# Patient Record
Sex: Male | Born: 1965
Health system: Southern US, Community
[De-identification: ages and names within clinical notes are randomized; demographics above are authoritative.]

## PROBLEM LIST (undated history)

## (undated) ENCOUNTER — Ambulatory Visit: Payer: BC Managed Care – PPO | Source: Home / Self Care

## (undated) HISTORY — PX: COLONOSCOPY: SHX174

## (undated) HISTORY — PX: KNEE ARTHROSCOPY: SUR90

---

## 2005-04-27 ENCOUNTER — Ambulatory Visit (HOSPITAL_COMMUNITY): Admission: RE | Admit: 2005-04-27 | Discharge: 2005-04-27 | Payer: Self-pay | Admitting: Orthopedic Surgery

## 2005-04-27 ENCOUNTER — Ambulatory Visit (HOSPITAL_BASED_OUTPATIENT_CLINIC_OR_DEPARTMENT_OTHER): Admission: RE | Admit: 2005-04-27 | Discharge: 2005-04-27 | Payer: Self-pay | Admitting: Orthopedic Surgery

## 2015-02-04 ENCOUNTER — Emergency Department (HOSPITAL_COMMUNITY)
Admission: EM | Admit: 2015-02-04 | Discharge: 2015-02-04 | Disposition: A | Discharge status: Home / Self Care | Payer: BLUE CROSS/BLUE SHIELD | Source: Home / Self Care | Attending: Family Medicine | Admitting: Family Medicine

## 2015-02-04 ENCOUNTER — Encounter (HOSPITAL_COMMUNITY): Payer: Self-pay | Admitting: Emergency Medicine

## 2015-02-04 DIAGNOSIS — K8050 Calculus of bile duct without cholangitis or cholecystitis without obstruction: Secondary | ICD-10-CM

## 2015-02-04 NOTE — Discharge Instructions (Signed)
Biliary Colic  °Biliary colic is a steady or irregular pain in the upper abdomen. It is usually under the right side of the rib cage. It happens when gallstones interfere with the normal flow of bile from the gallbladder. Bile is a liquid that helps to digest fats. Bile is made in the liver and stored in the gallbladder. When you eat a meal, bile passes from the gallbladder through the cystic duct and the common bile duct into the small intestine. There, it mixes with partially digested food. If a gallstone blocks either of these ducts, the normal flow of bile is blocked. The muscle cells in the bile duct contract forcefully to try to move the stone. This causes the pain of biliary colic.  °SYMPTOMS  °· A person with biliary colic usually complains of pain in the upper abdomen. This pain can be: °¨ In the center of the upper abdomen just below the breastbone. °¨ In the upper-right part of the abdomen, near the gallbladder and liver. °¨ Spread back toward the right shoulder blade. °· Nausea and vomiting. °· The pain usually occurs after eating. °· Biliary colic is usually triggered by the digestive system's demand for bile. The demand for bile is high after fatty meals. Symptoms can also occur when a person who has been fasting suddenly eats a very large meal. Most episodes of biliary colic pass after 1 to 5 hours. After the most intense pain passes, your abdomen may continue to ache mildly for about 24 hours. °DIAGNOSIS  °After you describe your symptoms, your caregiver will perform a physical exam. He or she will pay attention to the upper right portion of your belly (abdomen). This is the area of your liver and gallbladder. An ultrasound will help your caregiver look for gallstones. Specialized scans of the gallbladder may also be done. Blood tests may be done, especially if you have fever or if your pain persists. °PREVENTION  °Biliary colic can be prevented by controlling the risk factors for gallstones. Some of  these risk factors, such as heredity, increasing age, and pregnancy are a normal part of life. Obesity and a high-fat diet are risk factors you can change through a healthy lifestyle. Women going through menopause who take hormone replacement therapy (estrogen) are also more likely to develop biliary colic. °TREATMENT  °· Pain medication may be prescribed. °· You may be encouraged to eat a fat-free diet. °· If the first episode of biliary colic is severe, or episodes of colic keep retuning, surgery to remove the gallbladder (cholecystectomy) is usually recommended. This procedure can be done through small incisions using an instrument called a laparoscope. The procedure often requires a brief stay in the hospital. Some people can leave the hospital the same day. It is the most widely used treatment in people troubled by painful gallstones. It is effective and safe, with no complications in more than 90% of cases. °· If surgery cannot be done, medication that dissolves gallstones may be used. This medication is expensive and can take months or years to work. Only small stones will dissolve. °· Rarely, medication to dissolve gallstones is combined with a procedure called shock-wave lithotripsy. This procedure uses carefully aimed shock waves to break up gallstones. In many people treated with this procedure, gallstones form again within a few years. °PROGNOSIS  °If gallstones block your cystic duct or common bile duct, you are at risk for repeated episodes of biliary colic. There is also a 25% chance that you will develop   a gallbladder infection(acute cholecystitis), or some other complication of gallstones within 10 to 20 years. If you have surgery, schedule it at a time that is convenient for you and at a time when you are not sick. °HOME CARE INSTRUCTIONS  °· Drink plenty of clear fluids. °· Avoid fatty, greasy or fried foods, or any foods that make your pain worse. °· Take medications as directed. °SEEK MEDICAL  CARE IF:  °· You develop a fever over 100.5° F (38.1° C). °· Your pain gets worse over time. °· You develop nausea that prevents you from eating and drinking. °· You develop vomiting. °SEEK IMMEDIATE MEDICAL CARE IF:  °· You have continuous or severe belly (abdominal) pain which is not relieved with medications. °· You develop nausea and vomiting which is not relieved with medications. °· You have symptoms of biliary colic and you suddenly develop a fever and shaking chills. This may signal cholecystitis. Call your caregiver immediately. °· You develop a yellow color to your skin or the white part of your eyes (jaundice). °Document Released: 05/06/2006 Document Revised: 02/25/2012 Document Reviewed: 07/15/2008 °[employer]® Patient Information ©2015 [employer]. This information is not intended to replace advice given to you by your health care provider. Make sure you discuss any questions you have with your health care provider. ° °

## 2015-02-04 NOTE — ED Notes (Signed)
Pt states that he had some very sharp abdominal pain to occur at 1645 today pt states it was so sharp it caused him to throw up. Pt states it lasted for 30 minutes. Pt states the only thing he had to eat was a slice of pizza and a beer at 1630.

## 2015-02-04 NOTE — ED Provider Notes (Signed)
CSN: [id number]     Arrival date & time 02/04/15  1719 History   First MD Initiated Contact with Patient 02/04/15 [place]     Chief Complaint  Patient presents with  . Abdominal Pain   (Consider location/radiation/quality/duration/timing/severity/associated sxs/prior Treatment) HPI         49 year old male presents for evaluation of abdominal pain. He has had earlier today at about 4:30 he had a beer. 15 minutes later he had acute onset of right upper quadrant abdominal pain. Pain was severe and he also had nausea and vomiting. The pain radiated to his back and shoulder. He felt feverish while he was having the pain. After about 15 minutes they decided to go to the emergency department. On the weightbearing the pain started to resolve so they decided to come here instead. At this time, the pain has completely resolved and he is asymptomatic. He denies pain or nausea. He has never had this before. Denies any history of gallstones or any significant history of abdominal pains. He has never had any stomach surgeries.  History reviewed. No pertinent past medical history. History reviewed. No pertinent past surgical history. History reviewed. No pertinent family history. History  Substance Use Topics  . Smoking status: Never Smoker   . Smokeless tobacco: Not on file  . Alcohol Use: Yes    Review of Systems  Constitutional: Positive for fever.  Gastrointestinal: Positive for nausea, vomiting and abdominal pain. Negative for diarrhea and constipation.  All other systems reviewed and are negative.   Allergies  Review of patient's allergies indicates no known allergies.  Home Medications   Prior to Admission medications   Not on File   BP 134/88 mmHg  Pulse 80  Temp(Src) 97.4 F (36.3 C) (Oral)  Resp 16  SpO2 98% Physical Exam  Constitutional: He is oriented to person, place, and time. He appears well-developed and well-nourished. No distress.  HENT:  Head: Normocephalic.   Cardiovascular: Normal rate, regular rhythm and normal heart sounds.   Pulmonary/Chest: Effort normal and breath sounds normal. No respiratory distress.  Abdominal: Soft. Bowel sounds are normal. He exhibits no distension and no mass. There is no tenderness. There is no rebound and no guarding.  Neurological: He is alert and oriented to person, place, and time. Coordination normal.  Skin: Skin is warm and dry. No rash noted. He is not diaphoretic.  Psychiatric: He has a normal mood and affect. Judgment normal.  Nursing note and vitals reviewed.   ED Course  Procedures (including critical care time) Labs Review Labs Reviewed - No data to display  Imaging Review No results found.   MDM   1. Biliary colic    I believe it is most likely he had an attack of biliary colic. At time of this exam, the exam is completely normal. The abdomen is soft and nontender with normal bowel sounds. He is completely asymptomatic and in no distress. I discussed this most likely diagnosis with the patient at length. We discussed the need for follow-up, he will go visit his primary care physician to see about ordering an ultrasound. We also discussed what to do it the pain returns, he will go to the emergency department. Advised low-fat, bland diet for now.      Liam Graham, PA-C 02/04/15 1931

## 2015-02-08 ENCOUNTER — Other Ambulatory Visit: Payer: Self-pay | Admitting: Family Medicine

## 2015-02-08 DIAGNOSIS — K8050 Calculus of bile duct without cholangitis or cholecystitis without obstruction: Secondary | ICD-10-CM

## 2015-02-08 DIAGNOSIS — R1011 Right upper quadrant pain: Secondary | ICD-10-CM

## 2015-02-22 ENCOUNTER — Ambulatory Visit
Admission: RE | Admit: 2015-02-22 | Discharge: 2015-02-22 | Disposition: A | Discharge status: Home / Self Care | Payer: BLUE CROSS/BLUE SHIELD | Source: Ambulatory Visit | Attending: Family Medicine | Admitting: Family Medicine

## 2015-02-22 DIAGNOSIS — R1011 Right upper quadrant pain: Secondary | ICD-10-CM

## 2015-02-22 DIAGNOSIS — K8050 Calculus of bile duct without cholangitis or cholecystitis without obstruction: Secondary | ICD-10-CM

## 2016-11-01 DIAGNOSIS — Z86018 Personal history of other benign neoplasm: Secondary | ICD-10-CM | POA: Diagnosis not present

## 2016-11-01 DIAGNOSIS — L814 Other melanin hyperpigmentation: Secondary | ICD-10-CM | POA: Diagnosis not present

## 2016-11-01 DIAGNOSIS — L821 Other seborrheic keratosis: Secondary | ICD-10-CM | POA: Diagnosis not present

## 2016-11-01 DIAGNOSIS — D1800 Hemangioma unspecified site: Secondary | ICD-10-CM | POA: Diagnosis not present

## 2016-12-28 DIAGNOSIS — Z23 Encounter for immunization: Secondary | ICD-10-CM | POA: Diagnosis not present

## 2017-01-01 DIAGNOSIS — H2513 Age-related nuclear cataract, bilateral: Secondary | ICD-10-CM | POA: Diagnosis not present

## 2017-01-01 DIAGNOSIS — H401131 Primary open-angle glaucoma, bilateral, mild stage: Secondary | ICD-10-CM | POA: Diagnosis not present

## 2017-05-01 DIAGNOSIS — H2513 Age-related nuclear cataract, bilateral: Secondary | ICD-10-CM | POA: Diagnosis not present

## 2017-05-01 DIAGNOSIS — H401131 Primary open-angle glaucoma, bilateral, mild stage: Secondary | ICD-10-CM | POA: Diagnosis not present

## 2017-07-06 DIAGNOSIS — J029 Acute pharyngitis, unspecified: Secondary | ICD-10-CM | POA: Diagnosis not present

## 2017-07-06 DIAGNOSIS — B349 Viral infection, unspecified: Secondary | ICD-10-CM | POA: Diagnosis not present

## 2017-07-08 DIAGNOSIS — T7840XA Allergy, unspecified, initial encounter: Secondary | ICD-10-CM | POA: Diagnosis not present

## 2017-10-28 DIAGNOSIS — R030 Elevated blood-pressure reading, without diagnosis of hypertension: Secondary | ICD-10-CM | POA: Diagnosis not present

## 2017-10-28 DIAGNOSIS — Z125 Encounter for screening for malignant neoplasm of prostate: Secondary | ICD-10-CM | POA: Diagnosis not present

## 2017-10-28 DIAGNOSIS — F430 Acute stress reaction: Secondary | ICD-10-CM | POA: Diagnosis not present

## 2017-11-18 DIAGNOSIS — F430 Acute stress reaction: Secondary | ICD-10-CM | POA: Diagnosis not present

## 2017-11-18 DIAGNOSIS — R030 Elevated blood-pressure reading, without diagnosis of hypertension: Secondary | ICD-10-CM | POA: Diagnosis not present

## 2017-12-25 DIAGNOSIS — Z86018 Personal history of other benign neoplasm: Secondary | ICD-10-CM | POA: Diagnosis not present

## 2017-12-25 DIAGNOSIS — L814 Other melanin hyperpigmentation: Secondary | ICD-10-CM | POA: Diagnosis not present

## 2017-12-25 DIAGNOSIS — L821 Other seborrheic keratosis: Secondary | ICD-10-CM | POA: Diagnosis not present

## 2017-12-25 DIAGNOSIS — D1800 Hemangioma unspecified site: Secondary | ICD-10-CM | POA: Diagnosis not present

## 2018-04-28 DIAGNOSIS — Z13220 Encounter for screening for lipoid disorders: Secondary | ICD-10-CM | POA: Diagnosis not present

## 2018-04-28 DIAGNOSIS — R030 Elevated blood-pressure reading, without diagnosis of hypertension: Secondary | ICD-10-CM | POA: Diagnosis not present

## 2018-04-28 DIAGNOSIS — Z1211 Encounter for screening for malignant neoplasm of colon: Secondary | ICD-10-CM | POA: Diagnosis not present

## 2018-08-06 DIAGNOSIS — D1801 Hemangioma of skin and subcutaneous tissue: Secondary | ICD-10-CM | POA: Diagnosis not present

## 2018-08-06 DIAGNOSIS — L821 Other seborrheic keratosis: Secondary | ICD-10-CM | POA: Diagnosis not present

## 2018-08-15 DIAGNOSIS — K640 First degree hemorrhoids: Secondary | ICD-10-CM | POA: Diagnosis not present

## 2018-08-15 DIAGNOSIS — D126 Benign neoplasm of colon, unspecified: Secondary | ICD-10-CM | POA: Diagnosis not present

## 2018-08-15 DIAGNOSIS — Z86010 Personal history of colonic polyps: Secondary | ICD-10-CM | POA: Diagnosis not present

## 2018-08-20 DIAGNOSIS — D126 Benign neoplasm of colon, unspecified: Secondary | ICD-10-CM | POA: Diagnosis not present

## 2018-09-01 ENCOUNTER — Ambulatory Visit: Payer: Self-pay | Admitting: Surgery

## 2018-09-01 DIAGNOSIS — K621 Rectal polyp: Secondary | ICD-10-CM | POA: Diagnosis not present

## 2018-10-02 NOTE — Patient Instructions (Addendum)
Nicholas Bennett  10/02/2018   Your procedure is scheduled on: 10-08-18     Report to [facility]  Entrance    Report to Admitting at 10:15  AM    Call this number if you have problems the morning of surgery [phone]     Remember: DRINK 2 PRESURGERY ENSURE DRINKS THE NIGHT BEFORE SURGERY AT 1000 PM AND 1 PRESURGERY DRINK THE DAY OF THE PROCEDURE 3 HOURS PRIOR TO SCHEDULED SURGERY. NO SOLIDS AFTER MIDNIGHT THE DAY PRIOR TO THE SURGERY. NOTHING BY MOUTH EXCEPT CLEAR LIQUIDS UNTIL THREE HOURS PRIOR TO SCHEDULED SURGERY. PLEASE FINISH PRESURGERY ENSURE DRINK PER SURGEON ORDER 3 HOURS PRIOR TO SCHEDULED SURGERY TIME WHICH NEEDS TO BE COMPLETED AT 9:15 AM.     CLEAR LIQUID DIET   Foods Allowed                                                                     Foods Excluded  Coffee and tea, regular and decaf                             liquids that you cannot  Plain Jell-O in any flavor                                             see through such as: Fruit ices (not with fruit pulp)                                     milk, soups, orange juice  Iced Popsicles                                    All solid food Carbonated beverages, regular and diet                                    Cranberry, grape and apple juices Sports drinks like Gatorade Lightly seasoned clear broth or consume(fat free) Sugar, honey syrup  Sample Menu Breakfast                                Lunch                                     Supper Cranberry juice                    Beef broth                            Chicken broth Jell-O  Grape juice                           Apple juice Coffee or tea                        Jell-O                                      Popsicle                                                Coffee or tea                        Coffee or tea  _____________________________________________________________________    BRUSH  YOUR TEETH MORNING OF SURGERY AND RINSE YOUR MOUTH OUT, NO CHEWING GUM CANDY OR MINTS.     Take these medicines the morning of surgery with A SIP OF WATER: Loratadine (Claritin). You can bring and use your nasal spray as needed.                                You may not have any metal on your body including hair pins and              piercings  Do not wear jewelry, lotions, powders, cologne or deodorant             Men may shave face and neck.   Do not bring valuables to the hospital. [place].  Contacts, dentures or bridgework may not be worn into surgery.      Patients discharged the day of surgery will not be allowed to drive home.  Name and phone number of your driver: Nicholas Bennett [phone]  Special Instructions: Please consume a Clear Liquid diet along with your prep. Per your surgeon's instructions.              Please read over the following fact sheets you were given: _____________________________________________________________________  [facility] - Preparing for Surgery Before surgery, you can play an important role.  Because skin is not sterile, your skin needs to be as free of germs as possible.  You can reduce the number of germs on your skin by washing with CHG (chlorahexidine gluconate) soap before surgery.  CHG is an antiseptic cleaner which kills germs and bonds with the skin to continue killing germs even after washing. Please DO NOT use if you have an allergy to CHG or antibacterial soaps.  If your skin becomes reddened/irritated stop using the CHG and inform your nurse when you arrive at Short Stay. Do not shave (including legs and underarms) for at least 48 hours prior to the first CHG shower.  You may shave your face/neck. Please follow these instructions carefully:  1.  Shower with CHG Soap the night before surgery and the  morning of Surgery.  2.  If you choose to wash your hair, wash your hair first as usual  with your  normal  shampoo.  3.  After you  shampoo, rinse your hair and body thoroughly to remove the  shampoo.                           4.  Use CHG as you would any other liquid soap.  You can apply chg directly  to the skin and wash                       Gently with a scrungie or clean washcloth.  5.  Apply the CHG Soap to your body ONLY FROM THE NECK DOWN.   Do not use on face/ open                           Wound or open sores. Avoid contact with eyes, ears mouth and genitals (private parts).                       Wash face,  Genitals (private parts) with your normal soap.             6.  Wash thoroughly, paying special attention to the area where your surgery  will be performed.  7.  Thoroughly rinse your body with warm water from the neck down.  8.  DO NOT shower/wash with your normal soap after using and rinsing off  the CHG Soap.                9.  Pat yourself dry with a clean towel.            10.  Wear clean pajamas.            11.  Place clean sheets on your bed the night of your first shower and do not  sleep with pets. Day of Surgery : Do not apply any lotions/deodorants the morning of surgery.  Please wear clean clothes to the hospital/surgery center.  FAILURE TO FOLLOW THESE INSTRUCTIONS MAY RESULT IN THE CANCELLATION OF YOUR SURGERY PATIENT SIGNATURE_________________________________  NURSE SIGNATURE__________________________________  ________________________________________________________________________   Nicholas Bennett  An incentive spirometer is a tool that can help keep your lungs clear and active. This tool measures how well you are filling your lungs with each breath. Taking long deep breaths may help reverse or decrease the chance of developing breathing (pulmonary) problems (especially infection) following:  A long period of time when you are unable to move or be active. BEFORE THE PROCEDURE   If the spirometer includes an indicator to show your best  effort, your nurse or respiratory therapist will set it to a desired goal.  If possible, sit up straight or lean slightly forward. Try not to slouch.  Hold the incentive spirometer in an upright position. INSTRUCTIONS FOR USE  1. Sit on the edge of your bed if possible, or sit up as far as you can in bed or on a chair. 2. Hold the incentive spirometer in an upright position. 3. Breathe out normally. 4. Place the mouthpiece in your mouth and seal your lips tightly around it. 5. Breathe in slowly and as deeply as possible, raising the piston or the ball toward the top of the column. 6. Hold your breath for 3-5 seconds or for as long as possible. Allow the piston or ball to fall to the bottom of the column. 7. Remove the mouthpiece from your mouth and breathe out normally. 8. Rest for a few seconds  and repeat Steps 1 through 7 at least 10 times every 1-2 hours when you are awake. Take your time and take a few normal breaths between deep breaths. 9. The spirometer may include an indicator to show your best effort. Use the indicator as a goal to work toward during each repetition. 10. After each set of 10 deep breaths, practice coughing to be sure your lungs are clear. If you have an incision (the cut made at the time of surgery), support your incision when coughing by placing a pillow or rolled up towels firmly against it. Once you are able to get out of bed, walk around indoors and cough well. You may stop using the incentive spirometer when instructed by your caregiver.  RISKS AND COMPLICATIONS  Take your time so you do not get dizzy or light-headed.  If you are in pain, you may need to take or ask for pain medication before doing incentive spirometry. It is harder to take a deep breath if you are having pain. AFTER USE  Rest and breathe slowly and easily.  It can be helpful to keep track of a log of your progress. Your caregiver can provide you with a simple table to help with this. If you  are using the spirometer at home, follow these instructions: [place] IF:   You are having difficultly using the spirometer.  You have trouble using the spirometer as often as instructed.  Your pain medication is not giving enough relief while using the spirometer.  You develop fever of 100.5 F (38.1 C) or higher. SEEK IMMEDIATE MEDICAL CARE IF:   You cough up bloody sputum that had not been present before.  You develop fever of 102 F (38.9 C) or greater.  You develop worsening pain at or near the incision site. MAKE SURE YOU:   Understand these instructions.  Will watch your condition.  Will get help right away if you are not doing well or get worse. Document Released: 04/15/2007 Document Revised: 02/25/2012 Document Reviewed: 06/16/2007 [employer] Patient Information 2014 [place], [place].   ________________________________________________________________________

## 2018-10-03 ENCOUNTER — Other Ambulatory Visit: Payer: Self-pay

## 2018-10-03 ENCOUNTER — Encounter (HOSPITAL_COMMUNITY): Payer: Self-pay

## 2018-10-03 ENCOUNTER — Encounter (HOSPITAL_COMMUNITY)
Admission: RE | Admit: 2018-10-03 | Discharge: 2018-10-03 | Disposition: A | Discharge status: Home / Self Care | Payer: BLUE CROSS/BLUE SHIELD | Source: Ambulatory Visit | Attending: Surgery | Admitting: Surgery

## 2018-10-03 DIAGNOSIS — K621 Rectal polyp: Secondary | ICD-10-CM | POA: Insufficient documentation

## 2018-10-03 DIAGNOSIS — Z01812 Encounter for preprocedural laboratory examination: Secondary | ICD-10-CM | POA: Insufficient documentation

## 2018-10-03 LAB — BASIC METABOLIC PANEL
Anion gap: 10 (ref 5–15)
BUN: 14 mg/dL (ref 6–20)
CHLORIDE: 103 mmol/L (ref 98–111)
CO2: 25 mmol/L (ref 22–32)
Calcium: 9.4 mg/dL (ref 8.9–10.3)
Creatinine, Ser: 1.03 mg/dL (ref 0.61–1.24)
GFR calc non Af Amer: >60 mL/min (ref >60)
Glucose, Bld: 99 mg/dL (ref 70–99)
Potassium: 4.3 mmol/L (ref 3.5–5.1)
SODIUM: 138 mmol/L (ref 135–145)

## 2018-10-03 LAB — CBC
HEMATOCRIT: 46 % (ref 39.0–52.0)
HEMOGLOBIN: 15.3 g/dL (ref 13.0–17.0)
MCH: 32.2 pg (ref 26.0–34.0)
MCHC: 33.3 g/dL (ref 30.0–36.0)
MCV: 96.8 fL (ref 80.0–100.0)
NRBC: 0 % (ref 0.0–0.2)
Platelets: 266 10*3/uL (ref 150–400)
RBC: 4.75 MIL/uL (ref 4.22–5.81)
RDW: 12.5 % (ref 11.5–15.5)
WBC: 7.6 10*3/uL (ref 4.0–10.5)

## 2018-10-07 MED ORDER — BUPIVACAINE LIPOSOME 1.3 % IJ SUSP
20.0000 mL | Freq: Once | INTRAMUSCULAR | Status: DC
  Filled 2018-10-07: qty 20

## 2018-10-08 ENCOUNTER — Other Ambulatory Visit: Payer: Self-pay

## 2018-10-08 ENCOUNTER — Encounter (HOSPITAL_COMMUNITY)
Admission: RE | Disposition: A | Discharge status: Home / Self Care | Payer: Self-pay | Source: Ambulatory Visit | Attending: Surgery

## 2018-10-08 ENCOUNTER — Ambulatory Visit (HOSPITAL_COMMUNITY)
Admission: RE | Admit: 2018-10-08 | Discharge: 2018-10-08 | Disposition: A | Discharge status: Home / Self Care | Payer: BLUE CROSS/BLUE SHIELD | Source: Ambulatory Visit | Attending: Surgery | Admitting: Surgery

## 2018-10-08 ENCOUNTER — Ambulatory Visit (HOSPITAL_COMMUNITY): Payer: BLUE CROSS/BLUE SHIELD | Admitting: Certified Registered Nurse Anesthetist

## 2018-10-08 ENCOUNTER — Telehealth (HOSPITAL_COMMUNITY): Payer: Self-pay | Admitting: *Deleted

## 2018-10-08 ENCOUNTER — Encounter (HOSPITAL_COMMUNITY): Payer: Self-pay | Admitting: *Deleted

## 2018-10-08 DIAGNOSIS — Z79899 Other long term (current) drug therapy: Secondary | ICD-10-CM | POA: Diagnosis not present

## 2018-10-08 DIAGNOSIS — Z860100 Personal history of colon polyps, unspecified: Secondary | ICD-10-CM

## 2018-10-08 DIAGNOSIS — Z8371 Family history of colonic polyps: Secondary | ICD-10-CM | POA: Insufficient documentation

## 2018-10-08 DIAGNOSIS — K621 Rectal polyp: Secondary | ICD-10-CM

## 2018-10-08 DIAGNOSIS — Z7951 Long term (current) use of inhaled steroids: Secondary | ICD-10-CM | POA: Insufficient documentation

## 2018-10-08 DIAGNOSIS — D128 Benign neoplasm of rectum: Secondary | ICD-10-CM | POA: Insufficient documentation

## 2018-10-08 DIAGNOSIS — K649 Unspecified hemorrhoids: Secondary | ICD-10-CM | POA: Diagnosis not present

## 2018-10-08 HISTORY — PX: PARTIAL PROCTECTOMY BY TEM: SHX6011

## 2018-10-08 SURGERY — PARTIAL PROCTECTOMY BY TEM
Anesthesia: General | Site: Rectum

## 2018-10-08 MED ORDER — GABAPENTIN 300 MG PO CAPS
300.0000 mg | ORAL_CAPSULE | ORAL | Status: AC
  Administered 2018-10-08: 300 mg via ORAL
  Filled 2018-10-08: qty 1

## 2018-10-08 MED ORDER — FENTANYL CITRATE (PF) 100 MCG/2ML IJ SOLN: 25.0000 ug | INTRAMUSCULAR | Status: DC | PRN

## 2018-10-08 MED ORDER — MIDAZOLAM HCL 5 MG/5ML IJ SOLN
INTRAMUSCULAR | Status: DC | PRN
  Administered 2018-10-08 (×2): 1 mg via INTRAVENOUS

## 2018-10-08 MED ORDER — CELECOXIB 200 MG PO CAPS: 200.0000 mg | ORAL_CAPSULE | ORAL | Status: DC

## 2018-10-08 MED ORDER — OXYCODONE HCL 5 MG PO TABS
ORAL_TABLET | ORAL | Status: AC
  Filled 2018-10-08: qty 1

## 2018-10-08 MED ORDER — SODIUM CHLORIDE 0.9 % IV SOLN: 250.0000 mL | INTRAVENOUS | Status: DC | PRN

## 2018-10-08 MED ORDER — SODIUM CHLORIDE 0.9 % IV SOLN
2.0000 g | INTRAVENOUS | Status: AC
  Administered 2018-10-08: 2 g via INTRAVENOUS
  Filled 2018-10-08: qty 2

## 2018-10-08 MED ORDER — CHLORHEXIDINE GLUCONATE CLOTH 2 % EX PADS: 6.0000 | MEDICATED_PAD | Freq: Once | CUTANEOUS | Status: DC

## 2018-10-08 MED ORDER — MIDAZOLAM HCL 2 MG/2ML IJ SOLN
INTRAMUSCULAR | Status: AC
  Filled 2018-10-08: qty 2

## 2018-10-08 MED ORDER — KETAMINE HCL 10 MG/ML IJ SOLN
INTRAMUSCULAR | Status: DC | PRN
  Administered 2018-10-08: 30 mg via INTRAVENOUS

## 2018-10-08 MED ORDER — FENTANYL CITRATE (PF) 250 MCG/5ML IJ SOLN
INTRAMUSCULAR | Status: AC
  Filled 2018-10-08: qty 5

## 2018-10-08 MED ORDER — PROPOFOL 10 MG/ML IV BOLUS
INTRAVENOUS | Status: AC
  Filled 2018-10-08: qty 20

## 2018-10-08 MED ORDER — OXYCODONE HCL 5 MG PO TABS
5.0000 mg | ORAL_TABLET | ORAL | Status: DC | PRN
  Administered 2018-10-08: 5 mg via ORAL

## 2018-10-08 MED ORDER — LIDOCAINE 2% (20 MG/ML) 5 ML SYRINGE
INTRAMUSCULAR | Status: DC | PRN
  Administered 2018-10-08: 50 mg via INTRAVENOUS

## 2018-10-08 MED ORDER — MEPERIDINE HCL 50 MG/ML IJ SOLN: 6.2500 mg | INTRAMUSCULAR | Status: DC | PRN

## 2018-10-08 MED ORDER — BUPIVACAINE LIPOSOME 1.3 % IJ SUSP
INTRAMUSCULAR | Status: DC | PRN
  Administered 2018-10-08: 20 mL

## 2018-10-08 MED ORDER — SUGAMMADEX SODIUM 200 MG/2ML IV SOLN
INTRAVENOUS | Status: DC | PRN
  Administered 2018-10-08: 200 mg via INTRAVENOUS

## 2018-10-08 MED ORDER — BISACODYL 5 MG PO TBEC
20.0000 mg | DELAYED_RELEASE_TABLET | Freq: Once | ORAL | Status: DC
  Filled 2018-10-08: qty 4

## 2018-10-08 MED ORDER — OXYCODONE HCL 5 MG PO TABS: 5.0000 mg | ORAL_TABLET | Freq: Four times a day (QID) | ORAL | 0 refills | Status: AC | PRN

## 2018-10-08 MED ORDER — METOCLOPRAMIDE HCL 5 MG/ML IJ SOLN: 10.0000 mg | Freq: Once | INTRAMUSCULAR | Status: DC | PRN

## 2018-10-08 MED ORDER — ALVIMOPAN 12 MG PO CAPS
12.0000 mg | ORAL_CAPSULE | ORAL | Status: AC
  Administered 2018-10-08: 12 mg via ORAL
  Filled 2018-10-08: qty 1

## 2018-10-08 MED ORDER — AMBULATORY NON FORMULARY MEDICATION: 1.0000 "application " | Freq: Four times a day (QID) | 2 refills | Status: AC

## 2018-10-08 MED ORDER — POLYETHYLENE GLYCOL 3350 17 GM/SCOOP PO POWD: 1.0000 | Freq: Once | ORAL | Status: DC

## 2018-10-08 MED ORDER — BUPIVACAINE HCL (PF) 0.25 % IJ SOLN
INTRAMUSCULAR | Status: AC
  Filled 2018-10-08: qty 60

## 2018-10-08 MED ORDER — EPHEDRINE SULFATE-NACL 50-0.9 MG/10ML-% IV SOSY
PREFILLED_SYRINGE | INTRAVENOUS | Status: DC | PRN
  Administered 2018-10-08: 10 mg via INTRAVENOUS

## 2018-10-08 MED ORDER — LACTATED RINGERS IV SOLN: INTRAVENOUS | Status: DC

## 2018-10-08 MED ORDER — ACETAMINOPHEN 500 MG PO TABS
1000.0000 mg | ORAL_TABLET | ORAL | Status: AC
  Administered 2018-10-08: 1000 mg via ORAL
  Filled 2018-10-08: qty 2

## 2018-10-08 MED ORDER — PROPOFOL 10 MG/ML IV BOLUS
INTRAVENOUS | Status: DC | PRN
  Administered 2018-10-08: 200 mg via INTRAVENOUS

## 2018-10-08 MED ORDER — ONDANSETRON HCL 4 MG/2ML IJ SOLN
INTRAMUSCULAR | Status: DC | PRN
  Administered 2018-10-08: 4 mg via INTRAVENOUS

## 2018-10-08 MED ORDER — FENTANYL CITRATE (PF) 100 MCG/2ML IJ SOLN
INTRAMUSCULAR | Status: DC | PRN
  Administered 2018-10-08: 100 ug via INTRAVENOUS
  Administered 2018-10-08 (×2): 50 ug via INTRAVENOUS

## 2018-10-08 MED ORDER — LACTATED RINGERS IV SOLN
INTRAVENOUS | Status: DC
  Administered 2018-10-08 (×2): via INTRAVENOUS

## 2018-10-08 MED ORDER — LIDOCAINE 2% (20 MG/ML) 5 ML SYRINGE
INTRAMUSCULAR | Status: DC | PRN
  Administered 2018-10-08: 3.85 mL/h via INTRAVENOUS

## 2018-10-08 MED ORDER — ENOXAPARIN SODIUM 40 MG/0.4ML ~~LOC~~ SOLN
40.0000 mg | Freq: Once | SUBCUTANEOUS | Status: AC
  Administered 2018-10-08: 40 mg via SUBCUTANEOUS
  Filled 2018-10-08: qty 0.4

## 2018-10-08 MED ORDER — SODIUM CHLORIDE 0.9% FLUSH: 3.0000 mL | INTRAVENOUS | Status: DC | PRN

## 2018-10-08 MED ORDER — BUPIVACAINE HCL (PF) 0.25 % IJ SOLN
INTRAMUSCULAR | Status: AC
  Filled 2018-10-08: qty 30

## 2018-10-08 MED ORDER — ROCURONIUM BROMIDE 10 MG/ML (PF) SYRINGE
PREFILLED_SYRINGE | INTRAVENOUS | Status: DC | PRN
  Administered 2018-10-08: 10 mg via INTRAVENOUS
  Administered 2018-10-08: 50 mg via INTRAVENOUS

## 2018-10-08 MED ORDER — NEOMYCIN SULFATE 500 MG PO TABS
1000.0000 mg | ORAL_TABLET | ORAL | Status: DC
  Filled 2018-10-08: qty 2

## 2018-10-08 MED ORDER — EPINEPHRINE PF 1 MG/ML IJ SOLN
INTRAMUSCULAR | Status: AC
  Filled 2018-10-08: qty 1

## 2018-10-08 MED ORDER — SODIUM CHLORIDE 0.9 % IV SOLN
INTRAVENOUS | Status: DC
  Filled 2018-10-08: qty 6

## 2018-10-08 MED ORDER — METRONIDAZOLE 500 MG PO TABS
1000.0000 mg | ORAL_TABLET | ORAL | Status: DC
  Filled 2018-10-08: qty 2

## 2018-10-08 MED ORDER — SODIUM CHLORIDE 0.9% FLUSH: 3.0000 mL | Freq: Two times a day (BID) | INTRAVENOUS | Status: DC

## 2018-10-08 MED FILL — oxyCODONE HCL 5 MG TABS: 5 | 3 days supply | Qty: 30 | Fill #0

## 2018-10-08 SURGICAL SUPPLY — 50 items
BLADE SURG 15 STRL LF DISP TIS (BLADE) IMPLANT
BLADE SURG 15 STRL SS (BLADE)
BRIEF STRETCH FOR OB PAD LRG (UNDERPADS AND DIAPERS) IMPLANT
CABLE HIGH FREQUENCY MONO STRZ (ELECTRODE) ×2 IMPLANT
COVER WAND RF STERILE (DRAPES) ×1 IMPLANT
DRAPE LAPAROTOMY T 102X78X121 (DRAPES) ×1 IMPLANT
DRAPE SURG IRRIG POUCH 19X23 (DRAPES) ×2 IMPLANT
DRAPE WARM FLUID 44X44 (DRAPE) ×2 IMPLANT
DRSG PAD ABDOMINAL 8X10 ST (GAUZE/BANDAGES/DRESSINGS) IMPLANT
ELECT PENCIL ROCKER SW 15FT (MISCELLANEOUS) IMPLANT
ELECT REM PT RETURN 15FT ADLT (MISCELLANEOUS) ×2 IMPLANT
GAUZE 4X4 16PLY RFD (DISPOSABLE) ×2 IMPLANT
GAUZE SPONGE 4X4 12PLY STRL (GAUZE/BANDAGES/DRESSINGS) ×1 IMPLANT
GLOVE ECLIPSE 8.0 STRL XLNG CF (GLOVE) ×4 IMPLANT
GLOVE INDICATOR 8.0 STRL GRN (GLOVE) ×4 IMPLANT
GOWN STRL REUS W/TWL XL LVL3 (GOWN DISPOSABLE) ×6 IMPLANT
KIT BASIN OR (CUSTOM PROCEDURE TRAY) ×2 IMPLANT
LEGGING LITHOTOMY PAIR STRL (DRAPES) ×2 IMPLANT
LUBRICANT JELLY K Y 4OZ (MISCELLANEOUS) ×2 IMPLANT
NEEDLE HYPO 22GX1.5 SAFETY (NEEDLE) ×2 IMPLANT
PACK BASIC VI WITH GOWN DISP (CUSTOM PROCEDURE TRAY) ×2 IMPLANT
PAD POSITIONING PINK XL (MISCELLANEOUS) ×1 IMPLANT
RETRACTOR LONE STAR DISPOSABLE (INSTRUMENTS) IMPLANT
RETRACTOR STAY HOOK 5MM (MISCELLANEOUS) IMPLANT
SCISSORS LAP 5X35 DISP (ENDOMECHANICALS) ×2 IMPLANT
SET IRRIG TUBING LAPAROSCOPIC (IRRIGATION / IRRIGATOR) ×2 IMPLANT
SHEARS HARMONIC ACE PLUS 36CM (ENDOMECHANICALS) ×2 IMPLANT
SPONGE LAP 18X18 RF (DISPOSABLE) ×1 IMPLANT
STOPCOCK 4 WAY LG BORE MALE ST (IV SETS) ×1 IMPLANT
SUT CHROMIC 3 0 SH 27 (SUTURE) IMPLANT
SUT PDS AB 2-0 CT2 27 (SUTURE) IMPLANT
SUT PDS AB 3-0 SH 27 (SUTURE) IMPLANT
SUT SILK 2 0 (SUTURE)
SUT SILK 2 0 SH CR/8 (SUTURE) IMPLANT
SUT SILK 2-0 18XBRD TIE 12 (SUTURE) IMPLANT
SUT SILK 3 0 SH 30 (SUTURE) IMPLANT
SUT SILK 3 0 SH CR/8 (SUTURE) IMPLANT
SUT V-LOC BARB 180 2/0GR6 GS22 (SUTURE)
SUT VIC AB 2-0 UR6 27 (SUTURE) ×3 IMPLANT
SUT VIC AB 3-0 SH 27 (SUTURE)
SUT VIC AB 3-0 SH 27XBRD (SUTURE) IMPLANT
SUTURE V-LC BRB 180 2/0GR6GS22 (SUTURE) IMPLANT
SYR 20CC LL (SYRINGE) ×2 IMPLANT
SYR BULB IRRIGATION 50ML (SYRINGE) IMPLANT
TOWEL OR 17X26 10 PK STRL BLUE (TOWEL DISPOSABLE) ×2 IMPLANT
TOWEL OR NON WOVEN STRL DISP B (DISPOSABLE) ×2 IMPLANT
TRAY FOLEY MTR SLVR 16FR STAT (SET/KITS/TRAYS/PACK) ×2 IMPLANT
TUBING CONNECTING 10 (TUBING) IMPLANT
TUBING INSUF HEATED (TUBING) ×2 IMPLANT
YANKAUER SUCT BULB TIP 10FT TU (MISCELLANEOUS) IMPLANT

## 2018-10-08 NOTE — Discharge Instructions (Signed)
ANORECTAL SURGERY:  °POST OPERATIVE INSTRUCTIONS ° °###################################################################### ° °EAT °Start with a pureed / full liquid diet °After 24 hours, gradually transition to a high fiber diet.   ° °CONTROL PAIN °Control pain so you can tolerate bowel movements,  °walk, sleep, tolerate sneezing/coughing, and go up/down stairs. ° ° °HAVE A BOWEL MOVEMENT DAILY °Keep your bowels regular to avoid problems.   °Taking a fiber supplement every day to keep bowels soft.   °Try a laxative to override constipation. °Use an antidairrheal to slow down diarrhea.   °Call if not better after 2 tries ° °WALK °Walk an hour a day.  Control your pain to do that. °  °CALL IF YOU HAVE PROBLEMS/CONCERNS °Call if you are still struggling despite following these instructions. °Call if you have concerns not answered by these instructions ° °###################################################################### ° ° ° °1. Take your usually prescribed home medications unless otherwise directed. °2. DIET: Follow a light bland diet the first 24 hours after arrival home, such as soup, liquids, crackers, etc.  Be sure to include lots of fluids daily.  Avoid fast food or heavy meals as your are more likely to get nauseated.  Eat a low fat the next few days after surgery.   °3. PAIN CONTROL: °a. Pain is best controlled by a usual combination of three different methods TOGETHER: °i. Ice/Heat °ii. Over the counter pain medication °iii. Prescription pain medication °b. Expect swelling and discomfort in the anus/rectal area.  Warm water baths (30-60 minutes up to 6 times a day, especially after bowel meovements) will help. Use ice for the first few days to help decrease swelling and bruising, then switch to heat such as warm towels, sitz baths, warm baths, etc to help relax tight/sore spots and speed recovery.  Some people prefer to use ice alone, heat alone, alternating between ice & heat.  Experiment to what works  for you.   °c. It is helpful to take an over-the-counter pain medication continuously for the first few weeks.  Choose one of the following that works best for you: °i. Naproxen (Aleve, etc)  Two 220mg tabs twice a day °ii. Ibuprofen (Advil, etc) Three 200mg tabs four times a day (every meal & bedtime) °iii. Acetaminophen (Tylenol, etc) 500-650mg four times a day (every meal & bedtime) °d. A  prescription for pain medication (such as oxycodone, hydrocodone, etc) should be given to you upon discharge.  Take your pain medication as prescribed.  °i. If you are having problems/concerns with the prescription medicine (does not control pain, nausea, vomiting, rash, itching, etc), please call us [phone] to see if we need to switch you to a different pain medicine that will work better for you and/or control your side effect better. °ii. If you need a refill on your pain medication, please contact your pharmacy.  They will contact our office to request authorization. Prescriptions will not be filled after 5 pm or on week-ends.  If can take up to 48 hours for it to be filled & ready so avoid waiting until you are down to thel ast pill. °e. A topical cream (Dibucaine) or a prescription for a cream (such as diltiazem 2% gel) may be given to you.  Many people find relief with topical creams.  Some people find it burns too much.  Experiment.  If it helps, use it.  If it burns, don't using it. ° °Use a Sitz Bath 4-8 times a day for relief ° ° °Sitz Bath °A sitz bath   is a warm water bath taken in the sitting position that covers only the hips and buttocks. It may be used for either healing or hygiene purposes. Sitz baths are also used to relieve pain, itching, or muscle spasms. The water may contain medicine. Moist heat will help you heal and relax.  °HOME CARE INSTRUCTIONS  °Take 3 to 4 sitz baths a day. °1. Fill the bathtub half full with warm water. °2. Sit in the water and open the drain a little. °3. Turn on the warm  water to keep the tub half full. Keep the water running constantly. °4. Soak in the water for 15 to 20 minutes. °5. After the sitz bath, pat the affected area dry first. ° ° °4. KEEP YOUR BOWELS REGULAR °a. The goal is one soft bowel movement a day °b. Avoid getting constipated.  Between the surgery and the pain medications, it is common to experience some constipation.  Increasing fluid intake and taking a fiber supplement (such as Metamucil, Citrucel, FiberCon, MiraLax, etc) 2-3 times a day regularly will usually help prevent this problem from occurring.  A mild laxative (prune juice, Milk of Magnesia, MiraLax, etc) should be taken according to package directions if there are no bowel movements after 48 hours. °c. Watch out for diarrhea.  If you have many loose bowel movements, simplify your diet to bland foods & liquids for a few days.  Stop any stool softeners and decrease your fiber supplement.  Switching to mild anti-diarrheal medications (Kayopectate, Pepto Bismol) can help.  Can try an imodium/loperamide dose.  If this worsens or does not improve, please call us. ° °5. Wound Care ° °a. Remove your bandages with your first bowel movement, usually the day after surgery.  You may have packing if you had an abscess.  Let any packing or gauze fall come out.   °b. Wear an absorbent pad or soft cotton balls in your underwear as needed to catch any drainage and help keep the area  °c. Keep the area clean and dry.  Bathe / shower every day.  Keep the area clean by showering / bathing over the incision / wound.   It is okay to soak an open wound to help wash it.  Consider using a squeeze bottle filled with warm water to gently wash the anal area.  Wet wipes or showers / gentle washing after bowel movements is often less traumatic than regular toilet paper. °d. You will often notice bleeding with bowel movements.  This should slow down by the end of the first week of surgery.  Sitting on an ice pack can  help. °e. Expect some drainage.  This should slow down by the end of the first week of surgery, but you will have occasional bleeding or drainage up to a few months after surgery.  Wear an absorbent pad or soft cotton gauze in your underwear until the drainage stops. ° °6. ACTIVITIES as tolerated:   °a. You may resume regular (light) daily activities beginning the next day--such as daily self-care, walking, climbing stairs--gradually increasing activities as tolerated.  If you can walk 30 minutes without difficulty, it is safe to try more intense activity such as jogging, treadmill, bicycling, low-impact aerobics, swimming, etc. °b. Save the most intensive and strenuous activity for last such as sit-ups, heavy lifting, contact sports, etc  Refrain from any heavy lifting or straining until you are off narcotics for pain control.   °c. DO NOT PUSH THROUGH PAIN.  Let pain   be your guide: If it hurts to do something, don't do it.  Pain is your body warning you to avoid that activity for another week until the pain goes down. d. You may drive when you are no longer taking prescription pain medication, you can comfortably sit for long periods of time, and you can safely maneuver your car and apply brakes. e. Dennis Bast may have sexual intercourse when it is comfortable.  7. FOLLOW UP in our office a. Please call [facility] at [phone] to set up an appointment to see your surgeon in the office for a follow-up appointment approximately 2-3 weeks after your surgery. b. Make sure that you call for this appointment the day you arrive home to ensure a convenient appointment time.  8. IF YOU HAVE DISABILITY OR FAMILY LEAVE FORMS, BRING THEM TO THE OFFICE FOR PROCESSING.  DO NOT GIVE THEM TO YOUR DOCTOR.        WHEN TO CALL us [phone]: 1. Poor pain control 2. Reactions / problems with new medications (rash/itching, nausea, etc)  3. Fever over 101.5 F (38.5 C) 4. Inability to urinate 5. Nausea and/or  vomiting 6. Worsening swelling or bruising 7. Continued bleeding from incision. 8. Increased pain, redness, or drainage from the incision  The clinic staff is available to answer your questions during regular business hours (8:30am-5pm).  Please dont hesitate to call and ask to speak to one of our nurses for clinical concerns.   A surgeon from [facility] is always on call at the hospitals   If you have a medical emergency, go to the nearest emergency room or call 911.    [facility], [address], [place], [place], [place]  [address] ? MAIN: [phone] ? TOLL FREE: [phone] ? FAX [phone] [email or web address]    Colon Polyps Polyps are tissue growths inside the body. Polyps can grow in many places, including the large intestine (colon). A polyp may be a round bump or a mushroom-shaped growth. You could have one polyp or several. Most colon polyps are noncancerous (benign). However, some colon polyps can become cancerous over time. What are the causes? The exact cause of colon polyps is not known. What increases the risk? This condition is more likely to develop in people who:  Have a family history of colon cancer or colon polyps.  Are older than 30 or older than 45 if they are African American.  Have inflammatory bowel disease, such as ulcerative colitis or Crohn disease.  Are overweight.  Smoke cigarettes.  Do not get enough exercise.  Drink too much alcohol.  Eat a diet that is: ? High in fat and red meat. ? Low in fiber.  Had childhood cancer that was treated with abdominal radiation.  What are the signs or symptoms? Most polyps do not cause symptoms. If you have symptoms, they may include:  Blood coming from your rectum when having a bowel movement.  Blood in your stool.The stool may look dark red or black.  A change in bowel habits, such as constipation or diarrhea.  How is this  diagnosed? This condition is diagnosed with a colonoscopy. This is a procedure that uses a lighted, flexible scope to look at the inside of your colon. How is this treated? Treatment for this condition involves removing any polyps that are found. Those polyps will then be tested for cancer. If cancer is found, your health care provider will talk to you about options for colon  cancer treatment. Follow these instructions at home: Diet  Eat plenty of fiber, such as fruits, vegetables, and whole grains.  Eat foods that are high in calcium and vitamin D, such as milk, cheese, yogurt, eggs, liver, fish, and broccoli.  Limit foods high in fat, red meats, and processed meats, such as hot dogs, sausage, bacon, and lunch meats.  Maintain a healthy weight, or lose weight if recommended by your health care provider. General instructions  Do not smoke cigarettes.  Do not drink alcohol excessively.  Keep all follow-up visits as told by your health care provider. This is important. This includes keeping regularly scheduled colonoscopies. Talk to your health care provider about when you need a colonoscopy.  Exercise every day or as told by your health care provider. Contact a health care provider if:  You have new or worsening bleeding during a bowel movement.  You have new or increased blood in your stool.  You have a change in bowel habits.  You unexpectedly lose weight. This information is not intended to replace advice given to you by your health care provider. Make sure you discuss any questions you have with your health care provider. Document Released: 08/29/2004 Document Revised: 05/10/2016 Document Reviewed: 10/24/2015 Elsevier Interactive Patient Education  2018 [place].  TRANSANAL ENDOSCOPIC MICROSURGERY (TEM)  Transanal endoscopic microsurgery (TEM) was developed as a means to provide a less invasive way to operate on the rectum.  This may needed to provide a good resection of  part of the rectal wall to remove a large pre-cancerous polyp or an early cancer in which the patient cannot tolerate an open surgery or has an extremely hostile abdomen that makes the resection very risky.    The rectum is the last foot of the tubular digestive tract.  It resides in the pelvis, laying on top of your tailbone.  It is the final reservoir for stool before it is evacuated through the anus in the process of defecation, naturally stretching and contracting to allow time for someone to control bowel function.  The rectum is a common location for polyps or even a cancer.    In most cases when a polyp is found in the colon or rectum, a person often does not need to have a large resection of the rectum, but have it excised by a colonoscope.  However, some polyps are too large to be safely removed through endoscopy and require surgery.  Classically, this is done through an open incision where a low anterior resection or abdominoperineal resection where part or the entire rectum is removed.  Often, only part of a wall of the rectum needs to be removed.   Transanal endoscopic microsurgery (TEM) was developed as a means to provide a less invasive way to operate on the rectum.    Transanal endoscopic microsurgery (TEM) involves the patient to be placed under complete general anesthesia.  The anus is gently dilated and a metal tube is placed into the rectum.  Through the tube, absorbable gas is infused and long instruments are used to help access and cut out the polyp or tumor from part of the rectum.  The long instruments are also used to help sew the wound shut.  The specimen is then sent for pathology.  The procedure itself usually takes a few hours of time.  The patient stays at least overnight.  When they can tolerate a regular diet and have adequate pain control they usually leave the hospital in one or two  days.    The advantage of TEM is that as opposed to a long hospital stay, patient recovery is  much faster.  People  less likely to have bowel or other problems.  Careful pre-operative selection is essential to make sure that the patient is an appropriate candidate for the surgery.  Tumors or cancers that are very large or invasive usually are much more difficult to remove by this technique and are not considered the first option.  Persons who are most appropriate for this surgery are those with large polyps that have not become cancers or early cancers in patients who have high risks with larger surgery.   Sometimes a more aggressive laparoscopic or open follow up resection is needed if a cancer is found to give the best chance at pure.    In general, surgery has a better chance at cure if a cancer is found but may not needed if it is only a precancerous polyp.  Risks to the surgery such as pain, bleeding, abscess, leak, ostomy, and death are inherent but overall the TEM procedure is less stressful and less risky to the patient than a classic colorectal resection throughthe abdomen.  Your colorectal surgeon can help decide what option is best for you.

## 2018-10-08 NOTE — Op Note (Signed)
10/08/2018  1:32 PM  PATIENT:  Nicholas Bennett  52 y.o. male  Patient Care Team: Vernie Shanks, MD as PCP - General (Family Medicine) Michael Boston, MD as Consulting Physician (General Surgery) Wilford Corner, MD as Consulting Physician (Gastroenterology)  PRE-OPERATIVE DIAGNOSIS:  RECTAL POLYP  POST-OPERATIVE DIAGNOSIS:  RECTAL POLYP  PROCEDURE:  Procedure(s): PARTIAL PROCTECTOMY BY TEM OF RECTAL MASS  SURGEON:  Adin Hector, MD  ASSISTANT: Carlena Hurl, PA-C  ANESTHESIA:   local and general  EBL:  Total I/O In: 1000 [I.V.:1000] Out: -   Delay start of Pharmacological VTE agent (>24hrs) due to surgical blood loss or risk of bleeding:  no  DRAINS: none   SPECIMEN:  Source of Specimen:  RECTAL MASS (distal posterior rectal polyp)  DISPOSITION OF SPECIMEN:  PATHOLOGY  COUNTS:  YES  PLAN OF CARE: Admit for overnight observation  PATIENT DISPOSITION:  PACU - hemodynamically stable.  INDICATION: Pleasant gentleman with history of polyps.  Found to have a distal rectal polyp consistent with a serrated adenoma.  Abutting the anal verge.  Felt to be safely a little endoscopically.  Patient requested for transanal excision.  The anatomy & physiology of the digestive tract was discussed.  The pathophysiology of the rectal pathology was discussed.  Natural history risks without surgery was discussed.   I feel the risks of no intervention will lead to serious problems that outweigh the operative risks; therefore, I recommended surgery.    Laparoscopic & open abdominal techniques were discussed.  I recommended we start with a partial proctectomy by transanal endoscopic microsurgery (TEM) for excisional biopsy to remove the pathology and hopefully cure and/or control the pathology.  This technique can offer less operative risk and faster post-operative recovery.  Possible need for immediate or later abdominal surgery for further treatment was discussed.   Risks such as bleeding,  abscess, reoperation, ostomy, heart attack, death, and other risks were discussed.   I noted a good likelihood this will help address the problem.  Goals of post-operative recovery were discussed as well.  We will work to minimize complications.  An educational handout was given as well.  Questions were answered.  The patient expresses understanding & wishes to proceed with surgery.  OR FINDINGS: Adenomatous appearing 3 x 2 cm rectal polyp left posterior midline in the distal rectum from 1-4 cm from sphincters.  The resulting mass was 6x5 cm in size  Pin placement on pathology specimen: Proximal margin: Purple Distal margin: Green Right lateral: White Left lateral:  Yellow  The closure rests 0-2 cm from the anal verge in the posterior (8 to 4 o'clock) location.  It is 33% of the circumference.  DESCRIPTION: Informed consent was confirmed.  Patient received general anesthesia without difficulty.  Digital rectal exam confirmed mass primarily posterior midline.  Foley catheter sterilely placed.  Sequential compression devices active during the entire case.  The patient was placed in the lithotomy position, taking extra care to secure and protect the patient appropriately.  The perineum and perianal regions were prepped and draped in a sterile fashion.  Surgical timeout confirmed our plan.  I did a gentle digital rectal examination with gradual anal dilation to allow placement of the 4 cm TEO Stortz scope.  This was secured to the bed using the TEO clamping system.  We induced carbon dioxide insufflation intraluminally.  I oriented the Stortz scope and the patient such that the specimen rested towards the floor at the 6:00 position.  I could easily identify  the mass.  I went ahead and used tip point cautery to mark 1 cm margins circumferentially.  I then did a full thickness transection at the distal margin through anoderm.  Identified the sphincter complex and lifted the anoderm and distal rectum off  of it.  I came around laterally.  Switched over to harmonic dissection.  Lifted the specimen off the sphincter complex & pelvic canal for a good deep margin.  I gradually came more proximally.  Transected at the proximal margin.  I ensured hemostasis.  I inspected the main specimen and pinned it on thick cork board.  Pins as noted above.  I walked the specimen down to pathology and showed the [facility] pathology team for proper orientation.    I went back in and scrubbed in.  Hemostasis was good.  I reapproximated the wound with a 2-0 vicryl full-thickness bites of rectal wall in each corner.  Ran the sutures from lateral to medial towards the center, tying every few throws to have a running locking type suture.   Started left lateral and started right lateral.  Gradually alternated to work towards closing towards the center.  This helped close things together well.  I did meticulous inspection with fine tip instruments to confirm good watertight closure   Hemostasis excellent.  The lumen was quite patent.  Carbon dioxide evacuated & instruments removed.  The patient is being extubated go to recovery room.  I discussed operative findings, updated the patient's status, discussed probable steps to recovery, and gave postoperative recommendations to the patient's spouse.  Recommendations were made.  Questions were answered.  She expressed understanding & appreciation.   Adin Hector, MD, FACS, MASCRS Gastrointestinal and Minimally Invasive Surgery    [address], [place], [place] [address] [phone] Main / Paging [phone] Fax

## 2018-10-08 NOTE — Anesthesia Procedure Notes (Signed)
Procedure Name: Intubation Performed by: Garrel Ridgel, CRNA Pre-anesthesia Checklist: Patient identified, Emergency Drugs available, Suction available, Patient being monitored and Timeout performed Patient Re-evaluated:Patient Re-evaluated prior to induction Oxygen Delivery Method: Circle system utilized Preoxygenation: Pre-oxygenation with 100% oxygen Induction Type: IV induction Ventilation: Mask ventilation without difficulty Laryngoscope Size: Mac and 4 Grade View: Grade I Tube type: Oral Tube size: 7.5 mm Number of attempts: 1 Airway Equipment and Method: Stylet Placement Confirmation: ETT inserted through vocal cords under direct vision,  positive ETCO2 and breath sounds checked- equal and bilateral Secured at: 23 cm Tube secured with: Tape Dental Injury: Teeth and Oropharynx as per pre-operative assessment

## 2018-10-08 NOTE — Interval H&P Note (Signed)
History and Physical Interval Note:  10/08/2018 10:52 AM  Nicholas Bennett  has presented today for surgery, with the diagnosis of RECTAL POLYP  The various methods of treatment have been discussed with the patient and family. After consideration of risks, benefits and other options for treatment, the patient has consented to  Procedure(s): PARTIAL PROCTECTOMY BY TEM OF RECTAL MASS (N/A) as a surgical intervention .  The patient's history has been reviewed, patient examined, no change in status, stable for surgery.  I have reviewed the patient's chart and labs.  Questions were answered to the patient's satisfaction.    I have re-reviewed the the patient's records, history, medications, and allergies.  I have re-examined the patient.  I again discussed intraoperative plans and goals of post-operative recovery.  The patient agrees to proceed.  Nicholas Bennett  1966/10/15 [id number]  Patient Care Team: Vernie Shanks, MD as PCP - General (Family Medicine) Michael Boston, MD as Consulting Physician (General Surgery) Wilford Corner, MD as Consulting Physician (Gastroenterology)  Patient Active Problem List   Diagnosis Date Noted  . Sessile serrated rectal polyp 10/08/2018  . History of colonic polyps 10/08/2018    History reviewed. No pertinent past medical history.  Past Surgical History:  Procedure Laterality Date  . COLONOSCOPY  2016, 2019  . KNEE ARTHROSCOPY Right     Social History   Socioeconomic History  . Marital status: Married    Spouse name: Not on file  . Number of children: Not on file  . Years of education: Not on file  . Highest education level: Not on file  Occupational History  . Not on file  Social Needs  . Financial resource strain: Not on file  . Food insecurity:    Worry: Not on file    Inability: Not on file  . Transportation needs:    Medical: Not on file    Non-medical: Not on file  Tobacco Use  . Smoking status: Never Smoker  . Smokeless tobacco:  Never Used  Substance and Sexual Activity  . Alcohol use: Yes    Alcohol/week: 1.0 standard drinks    Types: 1 Cans of beer per week    Comment: 5 per week  . Drug use: Never  . Sexual activity: Yes  Lifestyle  . Physical activity:    Days per week: Not on file    Minutes per session: Not on file  . Stress: Not on file  Relationships  . Social connections:    Talks on phone: Not on file    Gets together: Not on file    Attends religious service: Not on file    Active member of club or organization: Not on file    Attends meetings of clubs or organizations: Not on file    Relationship status: Not on file  . Intimate partner violence:    Fear of current or ex partner: Not on file    Emotionally abused: Not on file    Physically abused: Not on file    Forced sexual activity: Not on file  Other Topics Concern  . Not on file  Social History Narrative  . Not on file    History reviewed. No pertinent family history.  Medications Prior to Admission  Medication Sig Dispense Refill Last Dose  . fluticasone (FLONASE) 50 MCG/ACT nasal spray Place 2 sprays into both nostrils daily.   Past Week at Unknown time  . loratadine (CLARITIN) 10 MG tablet Take 10 mg by mouth daily.  Past Week at Unknown time  . Multiple Vitamin (MULTIVITAMIN WITH MINERALS) TABS tablet Take 1 tablet by mouth daily.   Past Week at Unknown time    Current Facility-Administered Medications  Medication Dose Route Frequency Provider Last Rate Last Dose  . bisacodyl (DULCOLAX) EC tablet 20 mg  20 mg Oral Once Michael Boston, MD      . bupivacaine liposome (EXPAREL) 1.3 % injection 266 mg  20 mL Infiltration Once Michael Boston, MD      . cefoTEtan (CEFOTAN) 2 g in sodium chloride 0.9 % 100 mL IVPB  2 g Intravenous On Call to OR Michael Boston, MD      . celecoxib (CELEBREX) capsule 200 mg  200 mg Oral On Call to OR Michael Boston, MD      . Chlorhexidine Gluconate Cloth 2 % PADS 6 each  6 each Topical Once Michael Boston, MD       And  . Chlorhexidine Gluconate Cloth 2 % PADS 6 each  6 each Topical Once Michael Boston, MD      . clindamycin (CLEOCIN) 900 mg, gentamicin (GARAMYCIN) 240 mg in sodium chloride 0.9 % 1,000 mL for intraperitoneal lavage   Intraperitoneal To OR Michael Boston, MD      . lactated ringers infusion   Intravenous Continuous Montez Hageman, MD      . neomycin [facility]) tablet 1,000 mg  1,000 mg Oral 3 times per day Michael Boston, MD       And  . metroNIDAZOLE (FLAGYL) tablet 1,000 mg  1,000 mg Oral 3 times per day Michael Boston, MD      . polyethylene glycol powder (GLYCOLAX/MIRALAX) container 255 g  1 Container Oral Once Michael Boston, MD         No Known Allergies  BP 136/80   Pulse 75   Temp 98.5 F (36.9 C) (Oral)   Resp 18   Ht 6' (1.829 m)   Wt 85.8 kg   SpO2 100%   BMI 25.67 kg/m   Labs: No results found for this or any previous visit (from the past 48 hour(s)).  Imaging / Studies: No results found.   Adin Hector, M.D., F.A.C.S. Gastrointestinal and Minimally Invasive Surgery [facility] [address], [place], [place] [address] [phone] Main / Paging  10/08/2018 10:52 AM     Adin Hector

## 2018-10-08 NOTE — Transfer of Care (Signed)
Immediate Anesthesia Transfer of Care Note  Patient: Nicholas Bennett  Procedure(s) Performed: PARTIAL PROCTECTOMY BY TEM OF RECTAL MASS (N/A Rectum)  Patient Location: PACU  Anesthesia Type:General  Level of Consciousness: sedated, patient cooperative and responds to stimulation  Airway & Oxygen Therapy: Patient Spontanous Breathing and Patient connected to face mask oxygen  Post-op Assessment: Report given to RN and Post -op Vital signs reviewed and stable  Post vital signs: Reviewed and stable  Last Vitals:  Vitals Value Taken Time  BP 129/86 10/08/2018  1:48 PM  Temp 36.4 C 10/08/2018  1:48 PM  Pulse 70 10/08/2018  1:49 PM  Resp 16 10/08/2018  1:49 PM  SpO2 100 % 10/08/2018  1:49 PM  Vitals shown include unvalidated device data.  Last Pain:  Vitals:   10/08/18 1036  TempSrc:   PainSc: 0-No pain      Patients Stated Pain Goal: 5 (49/67/59 1638)  Complications: No apparent anesthesia complications

## 2018-10-08 NOTE — H&P (Signed)
Jilda Roche DOB: [birth year]  Patient Care Team: Vernie Shanks, MD as PCP - General (Family Medicine) Michael Boston, MD as Consulting Physician (General Surgery) Wilford Corner, MD as Consulting Physician (Gastroenterology)    Chief Complaint:The patient is a 52 year old male who presents with a colorectal polyp.  ` ` Patient sent for surgical consultation at the request of Dr Cannon Kettle The patient is a gentleman with history of polyps in the past. Had polypectomies for adenomatous polyps in 2016. Had 3 year follow-up. At least some dysplasia and a rectal mass. Surgical consultation requested. Patient had colonoscopy 08/15/2018. Small polyps in sigmoid & rectum removed. However a more distal rectal polypoid lesion ~3cm was partially excised in the distal rectum. Margin close the dentate line. Complete resection held off. Pathology came back consistent with "traditional serrated adenoma." I'm assuming there was concerns about safe removal on a distal rectal polyp. Request for transanal/TEM resection.  No personal nor family history of GI/colon cancer, inflammatory bowel disease, irritable bowel syndrome, allergy such as Celiac Sprue, dietary/dairy problems, colitis, ulcers nor gastritis. No recent sick contacts/gastroenteritis. No travel outside the country. No changes in diet. No dysphagia to solids or liquids. No significant heartburn or reflux. No hematochezia, hematemesis, coffee ground emesis. No evidence of prior gastric/peptic ulceration. Patient moves his bowels every day. Physically active. Can walk without difficulty.  (Review of systems as stated in this history (HPI) or in the review of systems. Otherwise all other 12 point ROS are negative) ` ` `   Past Surgical History Geni Bers Haggett; 09/01/2018 2:06 PM) Colon Polyp Removal - Colonoscopy  Knee Surgery  Right.  Diagnostic Studies History Geni Bers Haggett; 09/01/2018 2:06  PM) Colonoscopy  within last year  Allergies Geni Bers Haggett; 09/01/2018 2:06 PM) No Known Drug Allergies [09/01/2018]: Allergies Reconciled   Medication History Geni Bers Haggett; 09/01/2018 2:07 PM) [place] with Flavor Pack (420GM For Solution, Oral) Active. Multi Vitamin (Oral) Active. ZyrTEC Allergy (10MG  Tablet, Oral) Active. Flonase (50MCG/DOSE Inhaler, Nasal) Active. Medications Reconciled  Social History Geni Bers Haggett; 09/01/2018 2:06 PM) Alcohol use  Moderate alcohol use. Caffeine use  Coffee. No drug use  Tobacco use  Never smoker.  Family History Geni Bers Haggett; 09/01/2018 2:06 PM) Breast Cancer  Mother. Colon Polyps  Father. Heart Disease  Father.  Other Problems Geni Bers Haggett; 09/01/2018 2:06 PM) No pertinent past medical history     Review of Systems Geni Bers Haggett; 09/01/2018 2:06 PM) General Not Present- Appetite Loss, Chills, Fatigue, Fever, Night Sweats, Weight Gain and Weight Loss. Skin Not Present- Change in Wart/Mole, Dryness, Hives, Jaundice, New Lesions, Non-Healing Wounds, Rash and Ulcer. HEENT Present- Wears glasses/contact lenses. Not Present- Earache, Hearing Loss, Hoarseness, Nose Bleed, Oral Ulcers, Ringing in the Ears, Seasonal Allergies, Sinus Pain, Sore Throat, Visual Disturbances and Yellow Eyes. Respiratory Not Present- Bloody sputum, Chronic Cough, Difficulty Breathing, Snoring and Wheezing. Breast Not Present- Breast Mass, Breast Pain, Nipple Discharge and Skin Changes. Cardiovascular Not Present- Chest Pain, Difficulty Breathing Lying Down, Leg Cramps, Palpitations, Rapid Heart Rate, Shortness of Breath and Swelling of Extremities. Gastrointestinal Not Present- Abdominal Pain, Bloating, Bloody Stool, Change in Bowel Habits, Chronic diarrhea, Constipation, Difficulty Swallowing, Excessive gas, Gets full quickly at meals, Hemorrhoids, Indigestion, Nausea, Rectal Pain and Vomiting. Male Genitourinary Not  Present- Blood in Urine, Change in Urinary Stream, Frequency, Impotence, Nocturia, Painful Urination, Urgency and Urine Leakage. Musculoskeletal Not Present- Back Pain, Joint Pain, Joint Stiffness, Muscle Pain, Muscle Weakness and Swelling of Extremities. Neurological Not Present-  Decreased Memory, Fainting, Headaches, Numbness, Seizures, Tingling, Tremor, Trouble walking and Weakness. Psychiatric Not Present- Anxiety, Bipolar, Change in Sleep Pattern, Depression, Fearful and Frequent crying. Endocrine Not Present- Cold Intolerance, Excessive Hunger, Hair Changes, Heat Intolerance, Hot flashes and New Diabetes. Hematology Not Present- Blood Thinners, Easy Bruising, Excessive bleeding, Gland problems, HIV and Persistent Infections.  Vitals Geni Bers Haggett; 09/01/2018 2:07 PM) 09/01/2018 2:07 PM Weight: 189.4 lb Height: 71in Body Surface Area: 2.06 m Body Mass Index: 26.42 kg/m  Pain Level: 0/10 Temp.: 97.30F(Temporal)  Pulse: 73 (Regular)  P.OX: 99% (Room air) BP: 132/78 (Sitting, Left Arm, Standard)   BP 136/80   Pulse 75   Temp 98.5 F (36.9 C) (Oral)   Resp 18   Ht 6' (1.829 m)   Wt 85.8 kg   SpO2 100%   BMI 25.67 kg/m      Physical Exam Adin Hector MD; 09/01/2018 4:51 PM) General Mental Status-Alert. General Appearance-Not in acute distress, Not Sickly. Orientation-Oriented X3. Hydration-Well hydrated. Voice-Normal.  Integumentary Global Assessment Upon inspection and palpation of skin surfaces of the - Axillae: non-tender, no inflammation or ulceration, no drainage. and Distribution of scalp and body hair is normal. General Characteristics Temperature - normal warmth is noted.  Head and Neck Head-normocephalic, atraumatic with no lesions or palpable masses. Face Global Assessment - atraumatic, no absence of expression. Neck Global Assessment - no abnormal movements, no bruit auscultated on the right, no bruit auscultated on the  left, no decreased range of motion, non-tender. Trachea-midline. Thyroid Gland Characteristics - non-tender.  Eye Eyeball - Left-Extraocular movements intact, No Nystagmus. Eyeball - Right-Extraocular movements intact, No Nystagmus. Cornea - Left-No Hazy. Cornea - Right-No Hazy. Sclera/Conjunctiva - Left-No scleral icterus, No Discharge. Sclera/Conjunctiva - Right-No scleral icterus, No Discharge. Pupil - Left-Direct reaction to light normal. Pupil - Right-Direct reaction to light normal.  ENMT Ears Pinna - Left - no drainage observed, no generalized tenderness observed. Right - no drainage observed, no generalized tenderness observed. Nose and Sinuses External Inspection of the Nose - no destructive lesion observed. Inspection of the nares - Left - quiet respiration. Right - quiet respiration. Mouth and Throat Lips - Upper Lip - no fissures observed, no pallor noted. Lower Lip - no fissures observed, no pallor noted. Nasopharynx - no discharge present. Oral Cavity/Oropharynx - Tongue - no dryness observed. Oral Mucosa - no cyanosis observed. Hypopharynx - no evidence of airway distress observed.  Chest and Lung Exam Inspection Movements - Normal and Symmetrical. Accessory muscles - No use of accessory muscles in breathing. Palpation Palpation of the chest reveals - Non-tender. Auscultation Breath sounds - Normal and Clear.  Cardiovascular Auscultation Rhythm - Regular. Murmurs & Other Heart Sounds - Auscultation of the heart reveals - No Murmurs and No Systolic Clicks.  Abdomen Inspection Inspection of the abdomen reveals - No Visible peristalsis and No Abnormal pulsations. Umbilicus - No Bleeding, No Urine drainage. Palpation/Percussion Palpation and Percussion of the abdomen reveal - Soft, Non Tender, No Rebound tenderness, No Rigidity (guarding) and No Cutaneous hyperesthesia. Note: Abdomen soft. Nontender. Not distended. No umbilical or incisional  hernias. No guarding.   Male Genitourinary Sexual Maturity Tanner 5 - Adult hair pattern and Adult penile size and shape. Note: No inguinal hernias. Normal external genitalia. Epididymi, testes, and spermatic cords normal without any masses.   Rectal Note: Perianal skin clear. Normal sphincter tone. No fissure or fistula. No pilonidal disease. Grade 1-2 hemorrhoid piles by anoscopy and digital exam.   Some irregular mucosal changes  in the right anterior distal rectum aspect and scarring consistent with polypectomy site. About 2 cm from the anal verge.   Peripheral Vascular Upper Extremity Inspection - Left - No Cyanotic nailbeds, Not Ischemic. Right - No Cyanotic nailbeds, Not Ischemic.  Neurologic Neurologic evaluation reveals -normal attention span and ability to concentrate, able to name objects and repeat phrases. Appropriate fund of knowledge , normal sensation and normal coordination. Mental Status Affect - not angry, not paranoid. Cranial Nerves-Normal Bilaterally. Gait-Normal.  Neuropsychiatric Mental status exam performed with findings of-able to articulate well with normal speech/language, rate, volume and coherence, thought content normal with ability to perform basic computations and apply abstract reasoning and no evidence of hallucinations, delusions, obsessions or homicidal/suicidal ideation.  Musculoskeletal Global Assessment Spine, Ribs and Pelvis - no instability, subluxation or laxity. Right Upper Extremity - no instability, subluxation or laxity.  Lymphatic Head & Neck  General Head & Neck Lymphatics: Bilateral - Description - No Localized lymphadenopathy. Axillary  General Axillary Region: Bilateral - Description - No Localized lymphadenopathy. Femoral & Inguinal  Generalized Femoral & Inguinal Lymphatics: Left - Description - No Localized lymphadenopathy. Right - Description - No Localized lymphadenopathy.    Assessment & Plan  Adin Hector MD; 09/01/2018 4:54 PM) RECTAL POLYP (K62.1) Impression: Patient with polyps. 3 cm flat lesion noted in the distal rectum status post biopsy. Concern for traditional serrated adenoma. Not felt to be safely completely resectable given its distal status & close to the dentate line. Suspect right anterior positioning by digital & anoscopic exam.  I think the patient would benefit from TEM resection of the region. Most likely plan bedside proctoscopy to confirm location after he was prepped. TEM partial proctectomy for better margins. If no distinct polyp noted, would have follow-up flex sig endoscopy in 6 months to see if there is any recurrence for a better target for excision.  The anatomy & physiology of the digestive tract was discussed.  The pathophysiology of the rectal pathology was discussed.  Natural history risks without surgery was discussed.   I feel the risks of no intervention will lead to serious problems that outweigh the operative risks; therefore, I recommended surgery.    Laparoscopic & open abdominal techniques were discussed.  I recommended we start with a partial proctectomy by transanal endoscopic microsurgery (TEM) for excisional biopsy to remove the pathology and hopefully cure and/or control the pathology.  This technique can offer less operative risk and faster post-operative recovery.  Possible need for immediate or later abdominal surgery for further treatment was discussed.   Risks such as bleeding, abscess, reoperation, ostomy, heart attack, death, and other risks were discussed.   I noted a good likelihood this will help address the problem.  Goals of post-operative recovery were discussed as well.  We will work to minimize complications.  An educational handout was given as well.  Questions were answered.  The patient expresses understanding & wishes to proceed with surgery.       Adin Hector, MD, FACS, MASCRS Gastrointestinal and Minimally Invasive  Surgery    [address] [place], [place] [address] [phone] Main / Paging [phone] Fax

## 2018-10-08 NOTE — Anesthesia Preprocedure Evaluation (Signed)
Anesthesia Evaluation  Patient identified by MRN, date of birth, ID band Patient awake    Reviewed: Allergy & Precautions, NPO status , Patient's Chart, lab work & pertinent test results  Airway Mallampati: II  TM Distance: >3 FB Neck ROM: Full    Dental no notable dental hx.    Pulmonary neg pulmonary ROS,    Pulmonary exam normal breath sounds clear to auscultation       Cardiovascular negative cardio ROS Normal cardiovascular exam Rhythm:Regular Rate:Normal     Neuro/Psych negative neurological ROS  negative psych ROS   GI/Hepatic negative GI ROS, Neg liver ROS,   Endo/Other  negative endocrine ROS  Renal/GU negative Renal ROS  negative genitourinary   Musculoskeletal negative musculoskeletal ROS (+)   Abdominal   Peds negative pediatric ROS (+)  Hematology negative hematology ROS (+)   Anesthesia Other Findings   Reproductive/Obstetrics negative OB ROS                             Anesthesia Physical Anesthesia Plan  ASA: II  Anesthesia Plan: General   Post-op Pain Management:    Induction: Intravenous  PONV Risk Score and Plan: 2 and Ondansetron and Treatment may vary due to age or medical condition  Airway Management Planned: Oral ETT  Additional Equipment:   Intra-op Plan:   Post-operative Plan: Extubation in OR  Informed Consent: I have reviewed the patients History and Physical, chart, labs and discussed the procedure including the risks, benefits and alternatives for the proposed anesthesia with the patient or authorized representative who has indicated his/her understanding and acceptance.   Dental advisory given  Plan Discussed with: CRNA  Anesthesia Plan Comments:         Anesthesia Quick Evaluation

## 2018-10-09 ENCOUNTER — Encounter (HOSPITAL_COMMUNITY): Payer: Self-pay | Admitting: Surgery

## 2018-10-09 NOTE — Anesthesia Postprocedure Evaluation (Signed)
Anesthesia Post Note  Patient: Nicholas Bennett  Procedure(s) Performed: PARTIAL PROCTECTOMY BY TEM OF RECTAL MASS (N/A Rectum)     Patient location during evaluation: PACU Anesthesia Type: General Level of consciousness: awake and alert Pain management: pain level controlled Vital Signs Assessment: post-procedure vital signs reviewed and stable Respiratory status: spontaneous breathing, nonlabored ventilation, respiratory function stable and patient connected to nasal cannula oxygen Cardiovascular status: blood pressure returned to baseline and stable Postop Assessment: no apparent nausea or vomiting Anesthetic complications: no    Last Vitals:  Vitals:   10/08/18 1445 10/08/18 2130  BP: 119/77 124/75  Pulse: 64 95  Resp: 16 18  Temp: 36.6 C 36.9 C  SpO2: 99% 98%    Last Pain:  Vitals:   10/08/18 2130  TempSrc:   PainSc: 2                  Montez Hageman

## 2018-10-10 ENCOUNTER — Emergency Department (HOSPITAL_COMMUNITY)
Admission: EM | Admit: 2018-10-10 | Discharge: 2018-10-10 | Disposition: A | Discharge status: Home / Self Care | Payer: BLUE CROSS/BLUE SHIELD | Attending: Emergency Medicine | Admitting: Emergency Medicine

## 2018-10-10 ENCOUNTER — Encounter (HOSPITAL_COMMUNITY): Payer: Self-pay | Admitting: Emergency Medicine

## 2018-10-10 ENCOUNTER — Emergency Department (HOSPITAL_COMMUNITY): Payer: BLUE CROSS/BLUE SHIELD

## 2018-10-10 DIAGNOSIS — R5082 Postprocedural fever: Secondary | ICD-10-CM | POA: Diagnosis not present

## 2018-10-10 DIAGNOSIS — R300 Dysuria: Secondary | ICD-10-CM | POA: Insufficient documentation

## 2018-10-10 DIAGNOSIS — Z79899 Other long term (current) drug therapy: Secondary | ICD-10-CM | POA: Insufficient documentation

## 2018-10-10 DIAGNOSIS — K689 Other disorders of retroperitoneum: Secondary | ICD-10-CM | POA: Diagnosis not present

## 2018-10-10 DIAGNOSIS — R509 Fever, unspecified: Secondary | ICD-10-CM | POA: Diagnosis not present

## 2018-10-10 DIAGNOSIS — R1030 Lower abdominal pain, unspecified: Secondary | ICD-10-CM | POA: Insufficient documentation

## 2018-10-10 DIAGNOSIS — N200 Calculus of kidney: Secondary | ICD-10-CM | POA: Diagnosis not present

## 2018-10-10 DIAGNOSIS — K668 Other specified disorders of peritoneum: Secondary | ICD-10-CM | POA: Diagnosis not present

## 2018-10-10 LAB — COMPREHENSIVE METABOLIC PANEL
ALT: 25 U/L (ref 0–44)
AST: 21 U/L (ref 15–41)
Albumin: 3.8 g/dL (ref 3.5–5.0)
Alkaline Phosphatase: 40 U/L (ref 38–126)
Anion gap: 7 (ref 5–15)
BILIRUBIN TOTAL: 1.1 mg/dL (ref 0.3–1.2)
BUN: 9 mg/dL (ref 6–20)
CO2: 24 mmol/L (ref 22–32)
CREATININE: 1.09 mg/dL (ref 0.61–1.24)
Calcium: 8.8 mg/dL — ABNORMAL LOW (ref 8.9–10.3)
Chloride: 107 mmol/L (ref 98–111)
GFR calc Af Amer: >60 mL/min (ref >60)
GLUCOSE: 106 mg/dL — AB (ref 70–99)
Potassium: 3.7 mmol/L (ref 3.5–5.1)
Sodium: 138 mmol/L (ref 135–145)
TOTAL PROTEIN: 6.8 g/dL (ref 6.5–8.1)

## 2018-10-10 LAB — CBC WITH DIFFERENTIAL/PLATELET
Abs Immature Granulocytes: 0.16 10*3/uL — ABNORMAL HIGH (ref 0.00–0.07)
BASOS PCT: 0 %
Basophils Absolute: 0.1 10*3/uL (ref 0.0–0.1)
EOS PCT: 1 %
Eosinophils Absolute: 0.1 10*3/uL (ref 0.0–0.5)
HCT: 39.4 % (ref 39.0–52.0)
HEMOGLOBIN: 13.1 g/dL (ref 13.0–17.0)
Immature Granulocytes: 1 %
LYMPHS PCT: 5 %
Lymphs Abs: 0.9 10*3/uL (ref 0.7–4.0)
MCH: 32.4 pg (ref 26.0–34.0)
MCHC: 33.2 g/dL (ref 30.0–36.0)
MCV: 97.5 fL (ref 80.0–100.0)
MONO ABS: 1.5 10*3/uL — AB (ref 0.1–1.0)
MONOS PCT: 8 %
Neutro Abs: 15.5 10*3/uL — ABNORMAL HIGH (ref 1.7–7.7)
Neutrophils Relative %: 85 %
Platelets: 229 10*3/uL (ref 150–400)
RBC: 4.04 MIL/uL — AB (ref 4.22–5.81)
RDW: 12.5 % (ref 11.5–15.5)
WBC: 18.3 10*3/uL — AB (ref 4.0–10.5)
nRBC: 0 % (ref 0.0–0.2)

## 2018-10-10 LAB — URINALYSIS, ROUTINE W REFLEX MICROSCOPIC
Bacteria, UA: NONE SEEN
Bilirubin Urine: NEGATIVE
GLUCOSE, UA: NEGATIVE mg/dL
Ketones, ur: 20 mg/dL — AB
Leukocytes, UA: NEGATIVE
Nitrite: NEGATIVE
PH: 6 (ref 5.0–8.0)
PROTEIN: 30 mg/dL — AB
SPECIFIC GRAVITY, URINE: 1.014 (ref 1.005–1.030)

## 2018-10-10 LAB — I-STAT CG4 LACTIC ACID, ED: Lactic Acid, Venous: 1.03 mmol/L (ref 0.5–1.9)

## 2018-10-10 MED ORDER — IOPAMIDOL (ISOVUE-300) INJECTION 61%
100.0000 mL | Freq: Once | INTRAVENOUS | Status: AC | PRN
  Administered 2018-10-10: 100 mL via INTRAVENOUS

## 2018-10-10 MED ORDER — IOPAMIDOL (ISOVUE-300) INJECTION 61%
INTRAVENOUS | Status: AC
  Filled 2018-10-10: qty 100

## 2018-10-10 MED ORDER — PIPERACILLIN-TAZOBACTAM 3.375 G IVPB 30 MIN
3.3750 g | Freq: Once | INTRAVENOUS | Status: AC
  Administered 2018-10-10: 3.375 g via INTRAVENOUS
  Filled 2018-10-10: qty 50

## 2018-10-10 MED ORDER — AMOXICILLIN-POT CLAVULANATE 875-125 MG PO TABS: 1.0000 | ORAL_TABLET | Freq: Two times a day (BID) | ORAL | 0 refills | Status: AC

## 2018-10-10 MED ORDER — SODIUM CHLORIDE 0.9 % IV BOLUS
1000.0000 mL | Freq: Once | INTRAVENOUS | Status: AC
  Administered 2018-10-10: 1000 mL via INTRAVENOUS

## 2018-10-10 MED ORDER — SODIUM CHLORIDE 0.9 % IJ SOLN
INTRAMUSCULAR | Status: AC
  Filled 2018-10-10: qty 50

## 2018-10-10 MED ORDER — ACETAMINOPHEN 325 MG PO TABS
650.0000 mg | ORAL_TABLET | Freq: Once | ORAL | Status: AC | PRN
  Administered 2018-10-10: 650 mg via ORAL
  Filled 2018-10-10: qty 2

## 2018-10-10 NOTE — Discharge Instructions (Addendum)
Take augmentin twice daily for a week.   Continue tylenol, motrin for fever.   Take vicodin for severe rectal pain   You have some air around the surgical site that Dr. Johney Maine thought it was expected after an operation.   Dr. Johney Maine wants you to call the office on Monday for appointment next week.   Return to ER if you have worse abdominal pain, fever > 101, vomiting, unable to urinate or unable to have a bowel movement

## 2018-10-10 NOTE — ED Triage Notes (Signed)
Pt reports that he had colon polyp removed on Wed. Today reports fevers today to Vicodin and ibuprofen. Last dose Ibuprofen 600mg  take today at 930am. Reports that had dysuria and foul odor with urination, had foley cath during surgery.

## 2018-10-10 NOTE — ED Provider Notes (Signed)
[facility] DEPT Provider Note   CSN: [id number] Arrival date & time: 10/10/18  1525     History   Chief Complaint Chief Complaint  Patient presents with  . Fever  . Post-op Problem    HPI Nicholas Bennett is a 52 y.o. male hx of rectal polyp, here presenting with fever.  Patient is postop day #2 status post a trans-anal resection of rectal polyp.  Patient states that he was having some chills yesterday.  He states that he had a Foley placed during the surgery and was removed before he woke up.  Does have some dysuria after the procedure and of dysuria continued.  She states that he had trouble emptying his bladder but denies any history of enlarged prostate.  Patient states that he has some gas and was able to have several soft bowel movement with no obvious blood in it.  Patient does have some rectal pain that is controlled with Vicodin and denies any discharge from his buttock.   The history is provided by the patient.    History reviewed. No pertinent past medical history.  Patient Active Problem List   Diagnosis Date Noted  . 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 History of colonic polyps 10/08/2018    Past Surgical History:  Procedure Laterality Date  . COLONOSCOPY  2016, 2019  . KNEE ARTHROSCOPY Right   . PARTIAL PROCTECTOMY BY TEM N/A 10/08/2018   Procedure: PARTIAL PROCTECTOMY BY TEM OF RECTAL MASS;  Surgeon: Michael Boston, MD;  Location: [facility];  Service: General;  Laterality: N/A;        Home Medications    Prior to Admission medications   Medication Sig Start Date End Date Taking? Authorizing Provider  AMBULATORY NON FORMULARY MEDICATION Place 1 application rectally 4 (four) times daily. DILTIAZEM 2% compounded suspension.  (Get Rx filled at a compounding pharmacy, such as  [facility] [phone] or  [facility] [phone] in [place], [place]) 10/08/18  Yes Michael Boston,  MD  ibuprofen (ADVIL,MOTRIN) 600 MG tablet Take 600 mg by mouth every 6 (six) hours as needed (pain, alternating with oxycodone.).   Yes [provider]  Multiple Vitamin (MULTIVITAMIN WITH MINERALS) TABS tablet Take 1 tablet by mouth daily.   Yes [provider]  oxyCODONE (OXY IR/ROXICODONE) 5 MG immediate release tablet Take 1-2 tablets (5-10 mg total) by mouth every 6 (six) hours as needed for moderate pain, severe pain or breakthrough pain. 10/08/18  Liston Alba, MD    Family History No family history on file.  Social History Social History   Tobacco Use  . Smoking status: Never Smoker  . Smokeless tobacco: Never Used  Substance Use Topics  . Alcohol use: Yes    Alcohol/week: 1.0 standard drinks    Types: 1 Cans of beer per week    Comment: 5 per week  . Drug use: Never     Allergies   Patient has no known allergies.   Review of Systems Review of Systems  Constitutional: Positive for fever.  Genitourinary: Positive for difficulty urinating and frequency.  All other systems reviewed and are negative.    Physical Exam Updated Vital Signs BP 127/83 (BP Location: Left Arm)   Pulse (!) 102   Temp (!) 100.4 F (38 C) (Oral)   Resp 20   SpO2 96%   Physical Exam  Constitutional: He is oriented to person, place, and time. He appears well-developed.  HENT:  Head: Normocephalic.  MM slightly dry   Eyes: Pupils are equal, round, and reactive to light. Conjunctivae and EOM are normal.  Neck: Normal range of motion. Neck supple.  Cardiovascular: Normal rate, regular rhythm and normal heart sounds.  Pulmonary/Chest: Effort normal and breath sounds normal. No stridor. No respiratory distress.  Abdominal: Soft. Bowel sounds are normal.  Mild suprapubic vs diffuse lower abdominal tenderness   Genitourinary:  Genitourinary Comments: Rectal- no active bleeding, no obvious hemorrhoids   Musculoskeletal: Normal range of motion.  Neurological: He is  alert and oriented to person, place, and time. No cranial nerve deficit. Coordination normal.  Skin: Skin is warm.  Psychiatric: He has a normal mood and affect.  Nursing note and vitals reviewed.    ED Treatments / Results  Labs (all labs ordered are listed, but only abnormal results are displayed) Labs Reviewed  COMPREHENSIVE METABOLIC PANEL - Abnormal; Notable for the following components:      Result Value   Glucose, Bld 106 (*)    Calcium 8.8 (*)    All other components within normal limits  CBC WITH DIFFERENTIAL/PLATELET - Abnormal; Notable for the following components:   WBC 18.3 (*)    RBC 4.04 (*)    Neutro Abs 15.5 (*)    Monocytes Absolute 1.5 (*)    Abs Immature Granulocytes 0.16 (*)    All other components within normal limits  URINALYSIS, ROUTINE W REFLEX MICROSCOPIC - Abnormal; Notable for the following components:   Hgb urine dipstick MODERATE (*)    Ketones, ur 20 (*)    Protein, ur 30 (*)    All other components within normal limits  CULTURE, BLOOD (ROUTINE X 2)  CULTURE, BLOOD (ROUTINE X 2)  URINE CULTURE  I-STAT CG4 LACTIC ACID, ED  I-STAT CG4 LACTIC ACID, ED    EKG None  Radiology Ct Abdomen Pelvis W Contrast  Result Date: 10/10/2018 CLINICAL DATA:  s/p colon polyp removal now has fevers, dysuria. Status post transanal resection of a rectal mass at the anal verge on 10/08/2018. EXAM: CT ABDOMEN AND PELVIS WITH CONTRAST TECHNIQUE: Multidetector CT imaging of the abdomen and pelvis was performed using the standard protocol following bolus administration of intravenous contrast. CONTRAST:  ISOVUE-300 IOPAMIDOL (ISOVUE-300) INJECTION 61% COMPARISON:  None. FINDINGS: Lower chest: Heart size is normal. No pericardial effusion or significant coronary artery calcifications. No pulmonary nodules, pleural effusions, or infiltrates. Hepatobiliary: There is focal fatty infiltration adjacent to the falciform ligament. Otherwise the liver is homogeneous. Gallbladder  is contracted. Pancreas: Unremarkable. No pancreatic ductal dilatation or surrounding inflammatory changes. Spleen: Normal in size without focal abnormality. Adrenals/Urinary Tract: The adrenal glands are normal in appearance. Symmetric enhancement and excretion from the kidneys. Nonobstructing intrarenal calculus in the UPPER pole of the RIGHT kidney measures 2 millimeters. There is mild enhancement of the wall of the RIGHT ureter. The ureters are decompressed without evidence for obstruction. Urinary bladder is decompressed and slightly thick walled. There is stranding surrounding the urinary bladder, related to mesenteric fluid and air. Small air bubbles are identified along the posterior aspect of the bladder. No air is identified within the urinary bladder to indicate presence of a fistula. Stomach/Bowel: The stomach is normal in appearance. Small bowel loops are unremarkable. The appendix is well seen and has a normal appearance. The rectal wall is mildly thickened and somewhat irregular. There is stranding within the sigmoid mesentery. Significant retroperitoneal gas is identified throughout the pelvis and extending into the retrocrural regions in  the UPPER abdomen largest collections of gas are 1.8 x 1.9 centimeters in the LEFT perirectal fat and 1.8 x 1.6 centimeters in the RIGHT perirectal fat. There is thickening and stranding of the retroperitoneum extending from the anus superiorly anterior to the sacrum and along the pelvic sidewalls. No discrete, drainable fluid collection identified. Vascular/Lymphatic: No significant vascular findings are present. No enlarged abdominal or pelvic lymph nodes. Reproductive: Prostate is unremarkable. Other: Mesenteric fluid and small amount of ascites. Significant retroperitoneal gas. No intraperitoneal gas or abscess. Musculoskeletal: No acute or significant osseous findings. IMPRESSION: 1. Significant mesenteric stranding and retroperitoneal inflammation and air,  predominantly noted within the pelvis but extending into the UPPER abdomen. Findings are consistent with retroperitoneal perforation. No discrete abscess is identified at this time. 2. Nonobstructing nephrolithiasis in the RIGHT kidney. 3. Enhancement of the RIGHT ureter, consistent with ureteritis, and thickening of the bladder wall are likely secondary to retroperitoneal and pelvic inflammation. 4. Irregular thickening of the rectal wall consistent with inflammation/edema. These results were called by telephone at the time of interpretation on 10/10/2018 at 6:16 pm to Dr. Shirlyn Goltz , who verbally acknowledged these results. Electronically Signed   By: Nolon Nations M.D.   On: 10/10/2018 18:16    Procedures Procedures (including critical care time)  Medications Ordered in ED Medications  sodium chloride 0.9 % injection (has no administration in time range)  piperacillin-tazobactam (ZOSYN) IVPB 3.375 g (has no administration in time range)  acetaminophen (TYLENOL) tablet 650 mg (650 mg Oral Given 10/10/18 1749)  sodium chloride 0.9 % bolus 1,000 mL (1,000 mLs Intravenous New Bag/Given 10/10/18 1747)  iopamidol (ISOVUE-300) 61 % injection 100 mL (100 mLs Intravenous Contrast Given 10/10/18 1726)     Initial Impression / Assessment and Plan / ED Course  I have reviewed the triage vital signs and the nursing notes.  Pertinent labs & imaging results that were available during my care of the patient were reviewed by me and considered in my medical decision making (see chart for details).    Nox Talent is a 52 y.o. male here with fever, abdominal pain. POD # 2 s/p trans anal resection of rectal polyp. I think likely UTI vs pneumonia vs ileus vs colitis. Will get labs, lactate, CXR, CT ab/pel, UA.   6:26 PM WBC 18. CT showed retroperitoneal inflammation and air, no abscess. UA normal. I ordered blood cultures, zosyn. I called surgery for evaluation.  7 pm I discussed case with Dr. Johney Maine  from surgery. He remembered him well from 2 days ago. He reviewed CT and states that this is expected postop. Since patient has elevated WBC count, he recommend IV zosyn and dc home with augmentin. I told Dr. Johney Maine that there is a concerned for possible RP perforation but he states that this is expected postop and that patient doesn't need to be admitted for IV abx and doesn't need emergent surgery evaluation. Patient is comfortable in the ED without pain medicine. Dr. Johney Maine wants patient to call office in 2 days for appointment.    7:25 PM IV zosyn completed. Continues to feel well. Will dc home with augmentin. Told him to call Dr. Johney Maine' office on Monday. Gave strict return precautions     Final Clinical Impressions(s) / ED Diagnoses   Final diagnoses:  None    ED Discharge Orders    None       Drenda Freeze, MD 10/10/18 [phone]

## 2018-10-10 NOTE — Progress Notes (Signed)
Nicholas Bennett [id number] 1966-09-03  CARE TEAM:  PCP: Vernie Shanks, MD  Outpatient Care Team: Patient Care Team: Vernie Shanks, MD as PCP - General (Family Medicine) Michael Boston, MD as Consulting Physician (General Surgery) Wilford Corner, MD as Consulting Physician (Gastroenterology)  Inpatient Treatment Team: Treatment Team: Registered Nurse: Joline Maxcy, RN; Technician: Porfirio Mylar, EMT   Problem List:   Active Problems:   * No active hospital problems. *      10/08/2018   POST-OPERATIVE DIAGNOSIS:  RECTAL POLYP  PROCEDURE:  Procedure(s): PARTIAL PROCTECTOMY BY TEM OF RECTAL MASS  SURGEON:  Adin Hector, MD    Hospital Stay = 0 days  Assessment  No evidence of major proctitis or other issues today status post TM partial proctectomy.  Plan:  -Discussed with the ER physician.  Already receiving IV Zosyn.  Reasonable to send out on 5 days for some discomfort of Augmentin given leukocytosis..  Doubt he has major infection.  Is not unexpected to expect some presacral and retroperitoneal gas only 2 days status post carbon dioxide insufflation.  Most of is in the presacral space where I did surgery dissected as expected.  Minimal above the sacral promontory.  He does not have any peritonitis or proctitis.  He is not toxic nor sickly.  Denies any abdominal or rectal pressure pain.  Some dysuria but urinalysis negative for UTI.  Discussed with patient and family at bedside.  Nursing in room.  They feel reassured.  We will follow-up closely.  Verbally relayed that pathology was benign on the polyp.  Sessile serrated adenoma.  Margins negative.  They feel reassured.  -VTE prophylaxis- SCDs, etc -mobilize as tolerated to help recovery  20 minutes spent in review, evaluation, examination, counseling, and coordination of care.  More than 50% of that time was spent in counseling.  10/10/2018    Subjective: (Chief complaint)  Patient had  temperatures above 100.  Some dysuria.  Based on concerns family brought him to emergency room. Patient has had some burning with urination.  Was able to have bowel movement this morning without much dyskinesia.  No major urgency.  No major rectal bleeding.  Eating well.  Some discomfort sitting.  Prefers to stand or lie on side.  Wife, mother, emergency room nurse in room    Objective:  Vital signs:  Vitals:   10/10/18 1532 10/10/18 1846 10/10/18 1852 10/10/18 1919  BP: 127/83 106/60  104/62  Pulse: (!) 102 88  92  Resp: '20 18  17  ' Temp: (!) 100.4 F (38 C)  99.5 F (37.5 C)   TempSrc: Oral  Oral   SpO2: 96% 98%  97%       Intake/Output   Yesterday:  No intake/output data recorded. This shift:  No intake/output data recorded.  Bowel function:  Flatus: YES  BM:  YES  Drain: (No drain)   Physical Exam:  General: Pt awake/alert/oriented x4 in no acute distress Eyes: PERRL, normal EOM.  Sclera clear.  No icterus Neuro: CN II-XII intact w/o focal sensory/motor deficits. Lymph: No head/neck/groin lymphadenopathy Psych:  No delerium/psychosis/paranoia HENT: Normocephalic, Mucus membranes moist.  No thrush Neck: Supple, No tracheal deviation Chest: No chest wall pain w good excursion CV:  Pulses intact.  Regular rhythm MS: Normal AROM mjr joints.  No obvious deformity  Abdomen: Soft.  Nondistended.  Nontender.  No evidence of peritonitis.  No incarcerated hernias. No rectal bleeding or drainage.  Held off on internal digital exam  Ext:   No deformity.  No mjr edema.  No cyanosis Skin: No petechiae / purpura  Results:   Labs: Results for orders placed or performed during the hospital encounter of 10/10/18 (from the past 48 hour(s))  Urinalysis, Routine w reflex microscopic     Status: Abnormal   Collection Time: 10/10/18  3:36 PM  Result Value Ref Range   Color, Urine YELLOW YELLOW   APPearance CLEAR CLEAR   Specific Gravity, Urine 1.014 1.005 - 1.030     pH 6.0 5.0 - 8.0   Glucose, UA NEGATIVE NEGATIVE mg/dL   Hgb urine dipstick MODERATE (A) NEGATIVE   Bilirubin Urine NEGATIVE NEGATIVE   Ketones, ur 20 (A) NEGATIVE mg/dL   Protein, ur 30 (A) NEGATIVE mg/dL   Nitrite NEGATIVE NEGATIVE   Leukocytes, UA NEGATIVE NEGATIVE   RBC / HPF 0-5 0 - 5 RBC/hpf   WBC, UA 0-5 0 - 5 WBC/hpf   Bacteria, UA NONE SEEN NONE SEEN   Mucus PRESENT     Comment: Performed at [facility], [place] [address], [place], [place] [address]  Comprehensive metabolic panel     Status: Abnormal   Collection Time: 10/10/18  3:44 PM  Result Value Ref Range   Sodium 138 135 - 145 mmol/L   Potassium 3.7 3.5 - 5.1 mmol/L   Chloride 107 98 - 111 mmol/L   CO2 24 22 - 32 mmol/L   Glucose, Bld 106 (H) 70 - 99 mg/dL   BUN 9 6 - 20 mg/dL   Creatinine, Ser 1.09 0.61 - 1.24 mg/dL   Calcium 8.8 (L) 8.9 - 10.3 mg/dL   Total Protein 6.8 6.5 - 8.1 g/dL   Albumin 3.8 3.5 - 5.0 g/dL   AST 21 15 - 41 U/L   ALT 25 0 - 44 U/L   Alkaline Phosphatase 40 38 - 126 U/L   Total Bilirubin 1.1 0.3 - 1.2 mg/dL   GFR calc non Af Amer >60 >60 mL/min   GFR calc Af Amer >60 >60 mL/min    Comment: (NOTE) The eGFR has been calculated using the CKD EPI equation. This calculation has not been validated in all clinical situations. eGFR's persistently <60 mL/min signify possible Chronic Kidney Disease.    Anion gap 7 5 - 15    Comment: Performed at [facility], [place] [address], [place],  [address]  CBC with Differential     Status: Abnormal   Collection Time: 10/10/18  3:44 PM  Result Value Ref Range   WBC 18.3 (H) 4.0 - 10.5 K/uL   RBC 4.04 (L) 4.22 - 5.81 MIL/uL   Hemoglobin 13.1 13.0 - 17.0 g/dL   HCT 39.4 39.0 - 52.0 %   MCV 97.5 80.0 - 100.0 fL   MCH 32.4 26.0 - 34.0 pg   MCHC 33.2 30.0 - 36.0 g/dL   RDW 12.5 11.5 - 15.5 %   Platelets 229 150 - 400 K/uL   nRBC 0.0 0.0 - 0.2 %   Neutrophils Relative % 85 %   Neutro Abs 15.5 (H) 1.7 - 7.7  K/uL   Lymphocytes Relative 5 %   Lymphs Abs 0.9 0.7 - 4.0 K/uL   Monocytes Relative 8 %   Monocytes Absolute 1.5 (H) 0.1 - 1.0 K/uL   Eosinophils Relative 1 %   Eosinophils Absolute 0.1 0.0 - 0.5 K/uL   Basophils Relative 0 %   Basophils Absolute 0.1 0.0 - 0.1 K/uL   Immature Granulocytes 1 %  Abs Immature Granulocytes 0.16 (H) 0.00 - 0.07 K/uL    Comment: Performed at [facility], [place] [address], [place], [place] [address]  I-Stat CG4 Lactic Acid, ED     Status: None   Collection Time: 10/10/18  3:53 PM  Result Value Ref Range   Lactic Acid, Venous 1.03 0.5 - 1.9 mmol/L    Imaging / Studies: Dg Chest 2 View  Result Date: 10/10/2018 CLINICAL DATA:  Fever EXAM: CHEST - 2 VIEW COMPARISON:  None. FINDINGS: Normal heart size and mediastinal contours. No acute infiltrate or edema. No effusion or pneumothorax. No acute osseous findings. IMPRESSION: Negative chest. Electronically Signed   By: Monte Fantasia M.D.   On: 10/10/2018 18:28   Ct Abdomen Pelvis W Contrast  Result Date: 10/10/2018 CLINICAL DATA:  s/p colon polyp removal now has fevers, dysuria. Status post transanal resection of a rectal mass at the anal verge on 10/08/2018. EXAM: CT ABDOMEN AND PELVIS WITH CONTRAST TECHNIQUE: Multidetector CT imaging of the abdomen and pelvis was performed using the standard protocol following bolus administration of intravenous contrast. CONTRAST:  ISOVUE-300 IOPAMIDOL (ISOVUE-300) INJECTION 61% COMPARISON:  None. FINDINGS: Lower chest: Heart size is normal. No pericardial effusion or significant coronary artery calcifications. No pulmonary nodules, pleural effusions, or infiltrates. Hepatobiliary: There is focal fatty infiltration adjacent to the falciform ligament. Otherwise the liver is homogeneous. Gallbladder is contracted. Pancreas: Unremarkable. No pancreatic ductal dilatation or surrounding inflammatory changes. Spleen: Normal in size without focal abnormality.  Adrenals/Urinary Tract: The adrenal glands are normal in appearance. Symmetric enhancement and excretion from the kidneys. Nonobstructing intrarenal calculus in the UPPER pole of the RIGHT kidney measures 2 millimeters. There is mild enhancement of the wall of the RIGHT ureter. The ureters are decompressed without evidence for obstruction. Urinary bladder is decompressed and slightly thick walled. There is stranding surrounding the urinary bladder, related to mesenteric fluid and air. Small air bubbles are identified along the posterior aspect of the bladder. No air is identified within the urinary bladder to indicate presence of a fistula. Stomach/Bowel: The stomach is normal in appearance. Small bowel loops are unremarkable. The appendix is well seen and has a normal appearance. The rectal wall is mildly thickened and somewhat irregular. There is stranding within the sigmoid mesentery. Significant retroperitoneal gas is identified throughout the pelvis and extending into the retrocrural regions in the UPPER abdomen largest collections of gas are 1.8 x 1.9 centimeters in the LEFT perirectal fat and 1.8 x 1.6 centimeters in the RIGHT perirectal fat. There is thickening and stranding of the retroperitoneum extending from the anus superiorly anterior to the sacrum and along the pelvic sidewalls. No discrete, drainable fluid collection identified. Vascular/Lymphatic: No significant vascular findings are present. No enlarged abdominal or pelvic lymph nodes. Reproductive: Prostate is unremarkable. Other: Mesenteric fluid and small amount of ascites. Significant retroperitoneal gas. No intraperitoneal gas or abscess. Musculoskeletal: No acute or significant osseous findings. IMPRESSION: 1. Significant mesenteric stranding and retroperitoneal inflammation and air, predominantly noted within the pelvis but extending into the UPPER abdomen. Findings are consistent with retroperitoneal perforation. No discrete abscess is  identified at this time. 2. Nonobstructing nephrolithiasis in the RIGHT kidney. 3. Enhancement of the RIGHT ureter, consistent with ureteritis, and thickening of the bladder wall are likely secondary to retroperitoneal and pelvic inflammation. 4. Irregular thickening of the rectal wall consistent with inflammation/edema. These results were called by telephone at the time of interpretation on 10/10/2018 at 6:16 pm to Dr. Shirlyn Goltz , who  verbally acknowledged these results. Electronically Signed   By: Nolon Nations M.D.   On: 10/10/2018 18:16    Medications / Allergies: per chart  Antibiotics: Anti-infectives (From admission, onward)   Start     Dose/Rate Route Frequency Ordered Stop   10/10/18 1815  piperacillin-tazobactam (ZOSYN) IVPB 3.375 g     3.375 g 100 mL/hr over 30 Minutes Intravenous  Once 10/10/18 1807 10/10/18 1917   10/10/18 0000  amoxicillin-clavulanate (AUGMENTIN) 875-125 MG tablet     1 tablet Oral 2 times daily 10/10/18 1927          Note: Portions of this report may have been transcribed using voice recognition software. Every effort was made to ensure accuracy; however, inadvertent computerized transcription errors may be present.   Any transcriptional errors that result from this process are unintentional.     Adin Hector, MD, FACS, MASCRS Gastrointestinal and Minimally Invasive Surgery    [address], [place], [place] [address] [phone] Main / Paging [phone] Fax

## 2018-10-10 NOTE — ED Notes (Signed)
Patient transported to CT 

## 2018-10-10 NOTE — ED Notes (Signed)
Bladder scan showed 247 mL. Pt attempting to use the restroom now.

## 2018-10-10 NOTE — ED Notes (Signed)
Patient transported to X-ray 

## 2018-10-12 LAB — URINE CULTURE: Culture: NO GROWTH

## 2018-10-15 LAB — CULTURE, BLOOD (ROUTINE X 2)
CULTURE: NO GROWTH
Culture: NO GROWTH
SPECIAL REQUESTS: ADEQUATE

## 2018-11-07 DIAGNOSIS — H401131 Primary open-angle glaucoma, bilateral, mild stage: Secondary | ICD-10-CM | POA: Diagnosis not present

## 2018-11-07 DIAGNOSIS — H2513 Age-related nuclear cataract, bilateral: Secondary | ICD-10-CM | POA: Diagnosis not present

## 2018-12-25 DIAGNOSIS — L821 Other seborrheic keratosis: Secondary | ICD-10-CM | POA: Diagnosis not present

## 2018-12-25 DIAGNOSIS — Z86018 Personal history of other benign neoplasm: Secondary | ICD-10-CM | POA: Diagnosis not present

## 2018-12-25 DIAGNOSIS — L814 Other melanin hyperpigmentation: Secondary | ICD-10-CM | POA: Diagnosis not present

## 2018-12-25 DIAGNOSIS — D225 Melanocytic nevi of trunk: Secondary | ICD-10-CM | POA: Diagnosis not present

## 2019-02-11 DIAGNOSIS — H401131 Primary open-angle glaucoma, bilateral, mild stage: Secondary | ICD-10-CM | POA: Diagnosis not present

## 2019-02-11 DIAGNOSIS — H2513 Age-related nuclear cataract, bilateral: Secondary | ICD-10-CM | POA: Diagnosis not present

## 2019-03-19 IMAGING — CT CT ABD-PELV W/ CM
2 of 5 series · 14 of 46 positions shown, 16 images · IV contrast (ISOVUE)
Comparison: None.

CLINICAL DATA: s/p colon polyp removal now has fevers, dysuria.
Status post transanal resection of a rectal mass at the anal verge
on 10/08/2018.

EXAM:
CT ABDOMEN AND PELVIS WITH CONTRAST
TECHNIQUE: Multidetector CT imaging of the abdomen and pelvis was performed
using the standard protocol following bolus administration of
intravenous contrast.
CONTRAST:  AL89XM-RAA IOPAMIDOL (AL89XM-RAA) INJECTION 61%

[Series 2: axial st · axial · 0.90mm/px · z∈[-238,+228]mm · 11 of 107 slices shown, 13 images]
[im 7/107  soft-tissue]
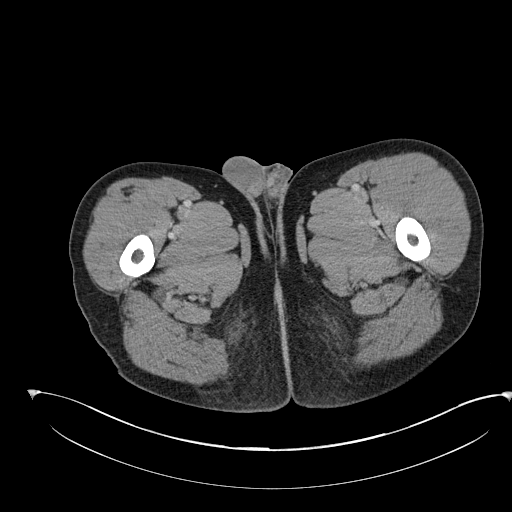
[im 7/107  bone]
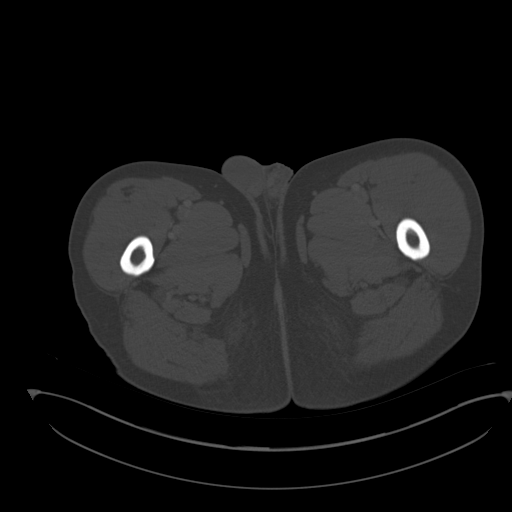
[im 20/107  soft-tissue]
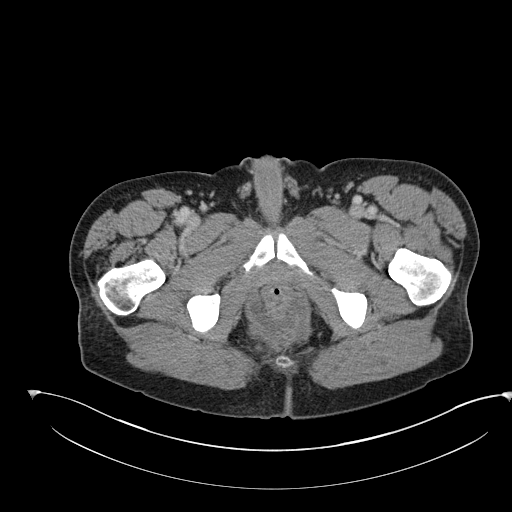
[im 27/107  soft-tissue]
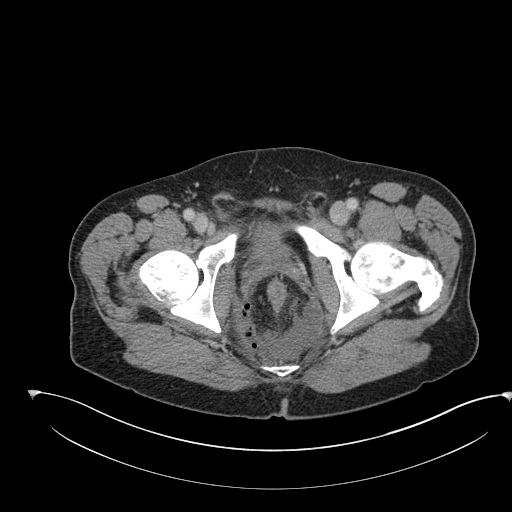
[im 34/107  soft-tissue]
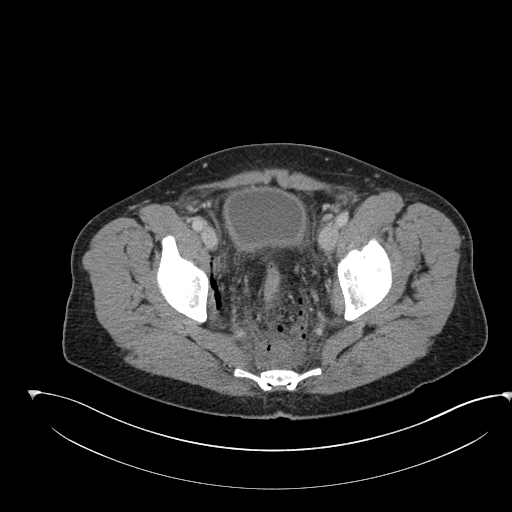
[im 47/107  soft-tissue]
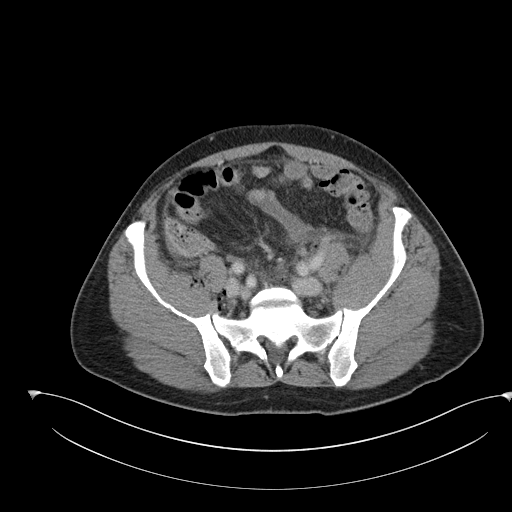
[im 54/107  soft-tissue]
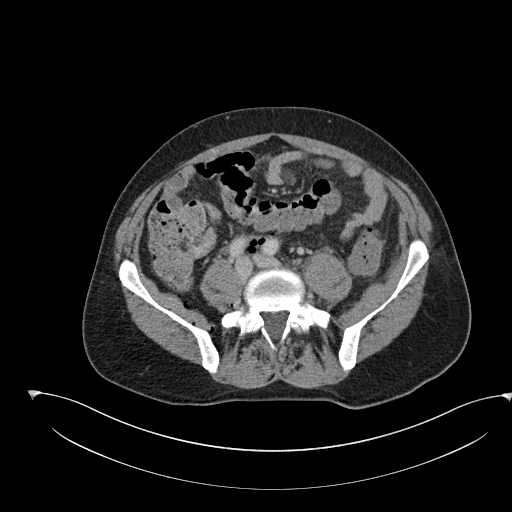
[im 60/107  soft-tissue]
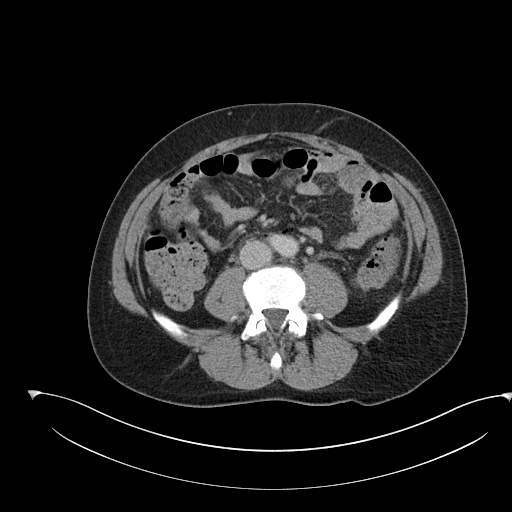
[im 73/107  soft-tissue]
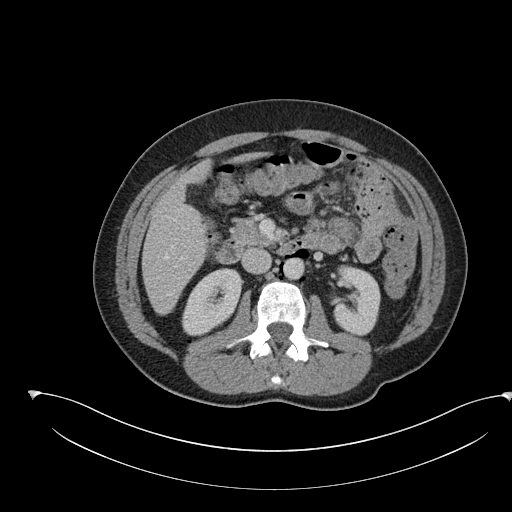
[im 80/107  soft-tissue]
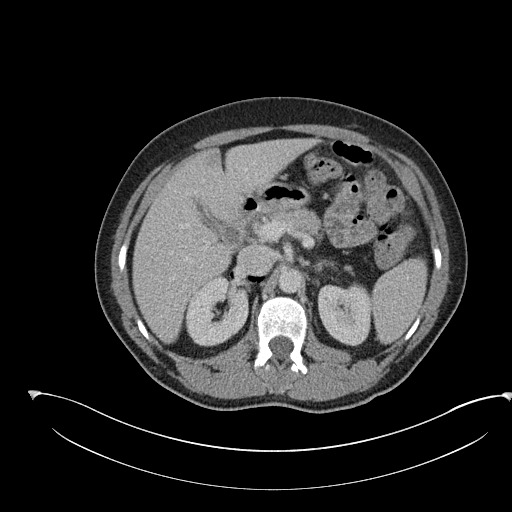
[im 80/107  bone]
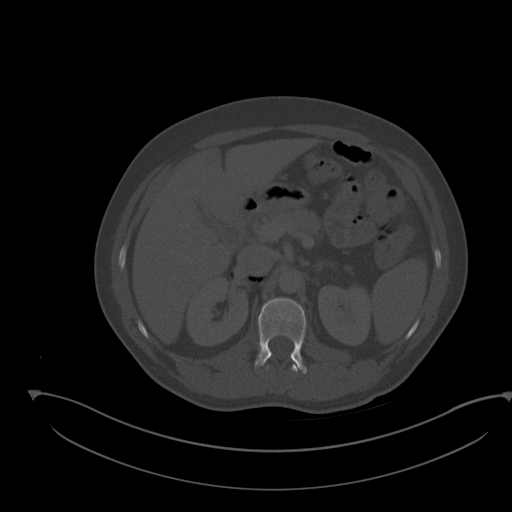
[im 87/107  soft-tissue]
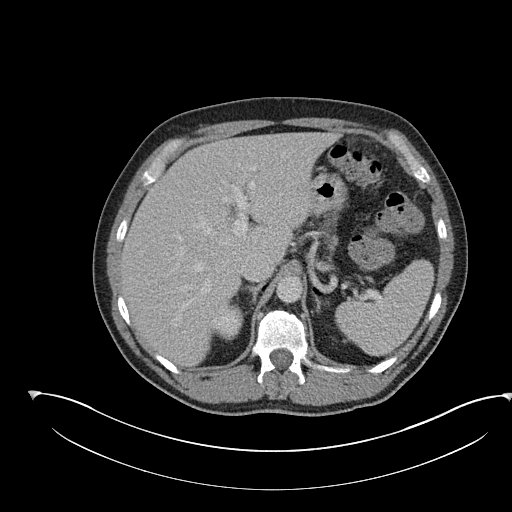
[im 100/107  soft-tissue]
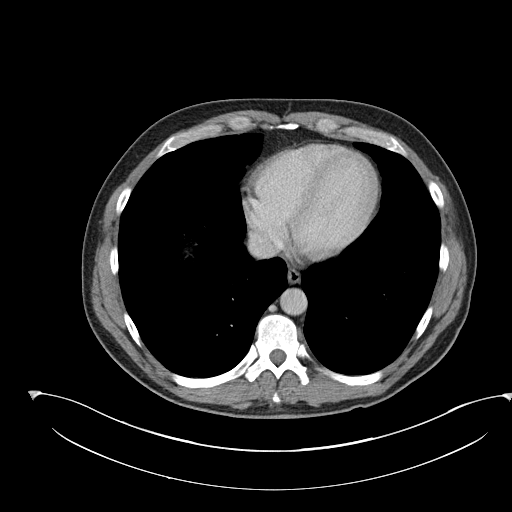

[Series 5: coronal st · coronal · 0.77mm/px · 3 of 100 slices shown]
[im 34/100  soft-tissue]
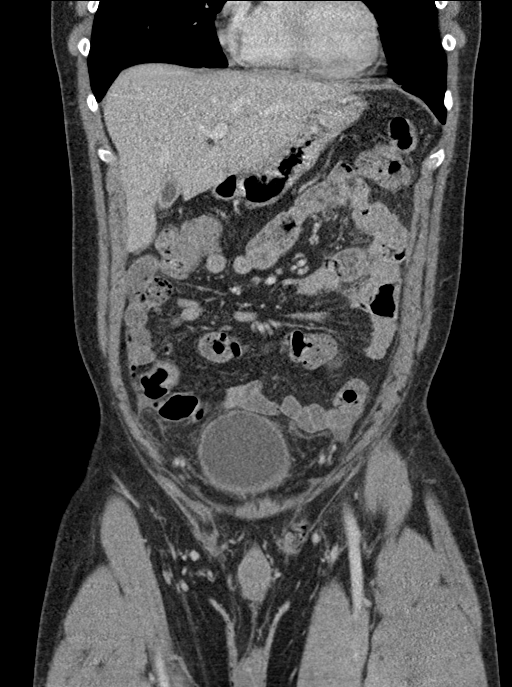
[im 45/100  soft-tissue]
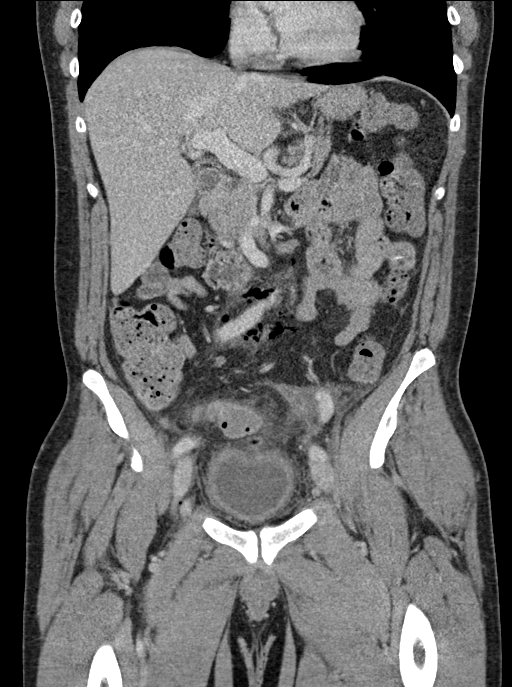
[im 56/100  soft-tissue]
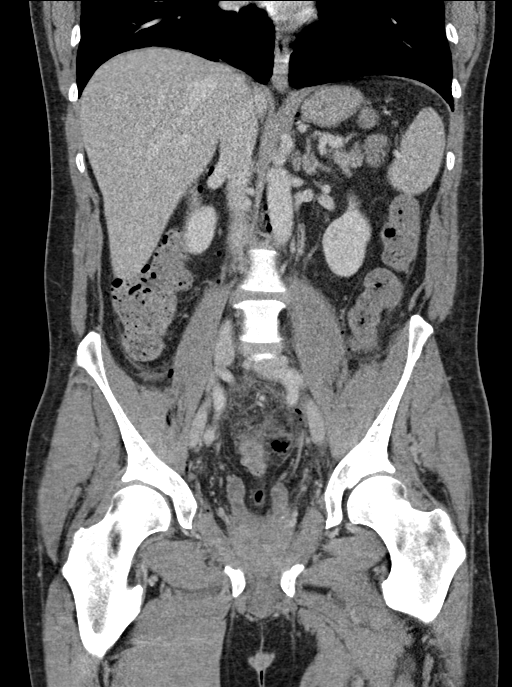

[14 of 46 positions shown; findings below may reference images not displayed]

FINDINGS: Lower chest: Heart size is normal. No pericardial effusion or
significant coronary artery calcifications. No pulmonary nodules,
pleural effusions, or infiltrates.

Hepatobiliary: There is focal fatty infiltration adjacent to the
falciform ligament. Otherwise the liver is homogeneous. Gallbladder
is contracted.

Pancreas: Unremarkable. No pancreatic ductal dilatation or
surrounding inflammatory changes.

Spleen: Normal in size without focal abnormality.

Adrenals/Urinary Tract: The adrenal glands are normal in appearance.
Symmetric enhancement and excretion from the kidneys. Nonobstructing
intrarenal calculus in the UPPER pole of the RIGHT kidney measures 2
millimeters. There is mild enhancement of the wall of the RIGHT
ureter. The ureters are decompressed without evidence for
obstruction. Urinary bladder is decompressed and slightly thick
walled. There is stranding surrounding the urinary bladder, related
to mesenteric fluid and air. Small air bubbles are identified along
the posterior aspect of the bladder. No air is identified within the
urinary bladder to indicate presence of a fistula.

Stomach/Bowel: The stomach is normal in appearance. Small bowel
loops are unremarkable. The appendix is well seen and has a normal
appearance.

The rectal wall is mildly thickened and somewhat irregular. There is
stranding within the sigmoid mesentery. Significant retroperitoneal
gas is identified throughout the pelvis and extending into the
retrocrural regions in the UPPER abdomen largest collections of gas
are 1.8 x 1.9 centimeters in the LEFT perirectal fat and 1.8 x
centimeters in the RIGHT perirectal fat. There is thickening and
stranding of the retroperitoneum extending from the anus superiorly
anterior to the sacrum and along the pelvic sidewalls. No discrete,
drainable fluid collection identified.

Vascular/Lymphatic: No significant vascular findings are present. No
enlarged abdominal or pelvic lymph nodes.

Reproductive: Prostate is unremarkable.

Other: Mesenteric fluid and small amount of ascites. Significant
retroperitoneal gas. No intraperitoneal gas or abscess.

Musculoskeletal: No acute or significant osseous findings.
IMPRESSION: 1. Significant mesenteric stranding and retroperitoneal inflammation
and air, predominantly noted within the pelvis but extending into
the UPPER abdomen. Findings are consistent with retroperitoneal
perforation. No discrete abscess is identified at this time.
2. Nonobstructing nephrolithiasis in the RIGHT kidney.
3. Enhancement of the RIGHT ureter, consistent with ureteritis, and
thickening of the bladder wall are likely secondary to
retroperitoneal and pelvic inflammation.
4. Irregular thickening of the rectal wall consistent with
inflammation/edema.

These results were called by telephone at the time of interpretation
on 10/10/2018 at [DATE] to Dr. BAMBUCAFE TARLA , who verbally
acknowledged these results.

## 2019-06-24 DIAGNOSIS — H2513 Age-related nuclear cataract, bilateral: Secondary | ICD-10-CM | POA: Diagnosis not present

## 2019-06-24 DIAGNOSIS — H401131 Primary open-angle glaucoma, bilateral, mild stage: Secondary | ICD-10-CM | POA: Diagnosis not present

## 2019-08-05 DIAGNOSIS — H401131 Primary open-angle glaucoma, bilateral, mild stage: Secondary | ICD-10-CM | POA: Diagnosis not present

## 2019-08-05 DIAGNOSIS — H2513 Age-related nuclear cataract, bilateral: Secondary | ICD-10-CM | POA: Diagnosis not present

## 2019-10-21 DIAGNOSIS — Z1159 Encounter for screening for other viral diseases: Secondary | ICD-10-CM | POA: Diagnosis not present

## 2019-10-26 DIAGNOSIS — K6289 Other specified diseases of anus and rectum: Secondary | ICD-10-CM | POA: Diagnosis not present

## 2019-10-26 DIAGNOSIS — K635 Polyp of colon: Secondary | ICD-10-CM | POA: Diagnosis not present

## 2019-10-26 DIAGNOSIS — Z86010 Personal history of colonic polyps: Secondary | ICD-10-CM | POA: Diagnosis not present

## 2019-10-26 DIAGNOSIS — K640 First degree hemorrhoids: Secondary | ICD-10-CM | POA: Diagnosis not present

## 2019-12-07 DIAGNOSIS — Z20828 Contact with and (suspected) exposure to other viral communicable diseases: Secondary | ICD-10-CM | POA: Diagnosis not present

## 2019-12-15 DIAGNOSIS — H401131 Primary open-angle glaucoma, bilateral, mild stage: Secondary | ICD-10-CM | POA: Diagnosis not present

## 2019-12-15 DIAGNOSIS — H2513 Age-related nuclear cataract, bilateral: Secondary | ICD-10-CM | POA: Diagnosis not present

## 2019-12-30 DIAGNOSIS — L570 Actinic keratosis: Secondary | ICD-10-CM | POA: Diagnosis not present

## 2019-12-30 DIAGNOSIS — L821 Other seborrheic keratosis: Secondary | ICD-10-CM | POA: Diagnosis not present

## 2019-12-30 DIAGNOSIS — D225 Melanocytic nevi of trunk: Secondary | ICD-10-CM | POA: Diagnosis not present

## 2019-12-30 DIAGNOSIS — Z86018 Personal history of other benign neoplasm: Secondary | ICD-10-CM | POA: Diagnosis not present

## 2019-12-30 DIAGNOSIS — L814 Other melanin hyperpigmentation: Secondary | ICD-10-CM | POA: Diagnosis not present

## 2020-04-25 DIAGNOSIS — H401131 Primary open-angle glaucoma, bilateral, mild stage: Secondary | ICD-10-CM | POA: Diagnosis not present

## 2020-04-25 DIAGNOSIS — H2513 Age-related nuclear cataract, bilateral: Secondary | ICD-10-CM | POA: Diagnosis not present

## 2020-07-18 DIAGNOSIS — Z23 Encounter for immunization: Secondary | ICD-10-CM | POA: Diagnosis not present

## 2020-07-18 DIAGNOSIS — Z0000 Encounter for general adult medical examination without abnormal findings: Secondary | ICD-10-CM | POA: Diagnosis not present

## 2020-07-18 DIAGNOSIS — Z125 Encounter for screening for malignant neoplasm of prostate: Secondary | ICD-10-CM | POA: Diagnosis not present

## 2020-07-18 DIAGNOSIS — Z13220 Encounter for screening for lipoid disorders: Secondary | ICD-10-CM | POA: Diagnosis not present

## 2020-07-18 DIAGNOSIS — Z86010 Personal history of colonic polyps: Secondary | ICD-10-CM | POA: Diagnosis not present

## 2020-11-24 DIAGNOSIS — J00 Acute nasopharyngitis [common cold]: Secondary | ICD-10-CM | POA: Diagnosis not present

## 2020-11-24 DIAGNOSIS — Z20822 Contact with and (suspected) exposure to covid-19: Secondary | ICD-10-CM | POA: Diagnosis not present

## 2020-11-24 DIAGNOSIS — R509 Fever, unspecified: Secondary | ICD-10-CM | POA: Diagnosis not present

## 2020-11-24 DIAGNOSIS — J069 Acute upper respiratory infection, unspecified: Secondary | ICD-10-CM | POA: Diagnosis not present

## 2020-11-24 DIAGNOSIS — Z03818 Encounter for observation for suspected exposure to other biological agents ruled out: Secondary | ICD-10-CM | POA: Diagnosis not present

## 2020-11-25 DIAGNOSIS — Z20822 Contact with and (suspected) exposure to covid-19: Secondary | ICD-10-CM | POA: Diagnosis not present

## 2021-02-28 DIAGNOSIS — Z20822 Contact with and (suspected) exposure to covid-19: Secondary | ICD-10-CM | POA: Diagnosis not present

## 2021-02-28 DIAGNOSIS — Z03818 Encounter for observation for suspected exposure to other biological agents ruled out: Secondary | ICD-10-CM | POA: Diagnosis not present

## 2021-04-30 ENCOUNTER — Ambulatory Visit
Admission: EM | Admit: 2021-04-30 | Discharge: 2021-04-30 | Disposition: A | Discharge status: Home / Self Care | Payer: BLUE CROSS/BLUE SHIELD | Attending: Emergency Medicine | Admitting: Emergency Medicine

## 2021-04-30 ENCOUNTER — Encounter: Payer: Self-pay | Admitting: Emergency Medicine

## 2021-04-30 ENCOUNTER — Other Ambulatory Visit: Payer: Self-pay

## 2021-04-30 DIAGNOSIS — S61215A Laceration without foreign body of left ring finger without damage to nail, initial encounter: Secondary | ICD-10-CM

## 2021-04-30 DIAGNOSIS — Z23 Encounter for immunization: Secondary | ICD-10-CM | POA: Diagnosis not present

## 2021-04-30 MED ORDER — TETANUS-DIPHTH-ACELL PERTUSSIS 5-2.5-18.5 LF-MCG/0.5 IM SUSY
0.5000 mL | PREFILLED_SYRINGE | Freq: Once | INTRAMUSCULAR | Status: AC
  Administered 2021-04-30: 0.5 mL via INTRAMUSCULAR

## 2021-04-30 NOTE — ED Triage Notes (Signed)
Pt here with laceration to left ring finger from rose trimmers; pt with flap of skin near nail; bleeding controlled with bandage

## 2021-04-30 NOTE — Discharge Instructions (Signed)

## 2021-04-30 NOTE — ED Provider Notes (Addendum)
[facility]    CSN: [id number] Arrival date & time: 04/30/21  1236      History   Chief Complaint Chief Complaint  Patient presents with  . Laceration    HPI Nicholas Bennett is a 55 y.o. male presenting today for evaluation of laceration to left ring finger.  Was in the yard trimming roses and accidentally cut his left ring finger.  Denies difficulty bending finger.  Unsure of last tetanus, believes greater than 5 years ago.  Denies use of blood thinners.  HPI  History reviewed. No pertinent past medical history.  Patient Active Problem List   Diagnosis Date Noted  . 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 History of colonic polyps 10/08/2018    Past Surgical History:  Procedure Laterality Date  . COLONOSCOPY  2016, 2019  . KNEE ARTHROSCOPY Right   . PARTIAL PROCTECTOMY BY TEM N/A 10/08/2018   Procedure: PARTIAL PROCTECTOMY BY TEM OF RECTAL MASS;  Surgeon: Michael Boston, MD;  Location: [facility];  Service: General;  Laterality: N/A;       Home Medications    Prior to Admission medications   Medication Sig Start Date End Date Taking? Authorizing Provider  AMBULATORY NON FORMULARY MEDICATION Place 1 application rectally 4 (four) times daily. DILTIAZEM 2% compounded suspension.  (Get Rx filled at a compounding pharmacy, such as  [facility] [phone] or  [facility] [phone] in [place], [place]) 10/08/18   Michael Boston, MD  amoxicillin-clavulanate (AUGMENTIN) 875-125 MG tablet Take 1 tablet by mouth 2 (two) times daily. One po bid x 7 days Patient not taking: Reported on 04/30/2021 10/10/18   Drenda Freeze, MD  ibuprofen (ADVIL,MOTRIN) 600 MG tablet Take 600 mg by mouth every 6 (six) hours as needed (pain, alternating with oxycodone.).    [provider]  Multiple Vitamin (MULTIVITAMIN WITH MINERALS) TABS tablet Take 1 tablet by mouth daily.    [provider]  oxyCODONE (OXY  IR/ROXICODONE) 5 MG immediate release tablet Take 1-2 tablets (5-10 mg total) by mouth every 6 (six) hours as needed for moderate pain, severe pain or breakthrough pain. Patient not taking: Reported on 04/30/2021 10/08/18   Michael Boston, MD    Family History History reviewed. No pertinent family history.  Social History Social History   Tobacco Use  . Smoking status: Never Smoker  . Smokeless tobacco: Never Used  Vaping Use  . Vaping Use: Never used  Substance Use Topics  . Alcohol use: Yes    Alcohol/week: 1.0 standard drink    Types: 1 Cans of beer per week    Comment: 5 per week  . Drug use: Never     Allergies   Patient has no known allergies.   Review of Systems Review of Systems  Constitutional: Negative for fatigue and fever.  Eyes: Negative for redness, itching and visual disturbance.  Respiratory: Negative for shortness of breath.   Cardiovascular: Negative for chest pain and leg swelling.  Gastrointestinal: Negative for nausea and vomiting.  Musculoskeletal: Positive for arthralgias. Negative for myalgias.  Skin: Positive for color change and wound. Negative for rash.  Neurological: Negative for dizziness, syncope, weakness, light-headedness and headaches.     Physical Exam Triage Vital Signs ED Triage Vitals  Enc Vitals Group     BP 04/30/21 1251 (!) 162/80     Pulse Rate 04/30/21 1251 92     Resp 04/30/21 1251 18     Temp 04/30/21  1251 98.3 F (36.8 C)     Temp Source 04/30/21 1251 Oral     SpO2 04/30/21 1251 96 %     Weight --      Height --      Head Circumference --      Peak Flow --      Pain Score 04/30/21 1252 7     Pain Loc --      Pain Edu? --      Excl. in [place]? --    No data found.  Updated Vital Signs BP (!) 162/80 (BP Location: Right Arm)   Pulse 92   Temp 98.3 F (36.8 C) (Oral)   Resp 18   SpO2 96%   Visual Acuity Right Eye Distance:   Left Eye Distance:   Bilateral Distance:    Right Eye Near:   Left Eye Near:     Bilateral Near:     Physical Exam Vitals and nursing note reviewed.  Constitutional:      Appearance: He is well-developed.     Comments: No acute distress  HENT:     Head: Normocephalic and atraumatic.     Nose: Nose normal.  Eyes:     Conjunctiva/sclera: Conjunctivae normal.  Cardiovascular:     Rate and Rhythm: Normal rate.  Pulmonary:     Effort: Pulmonary effort is normal. No respiratory distress.  Abdominal:     General: There is no distension.  Musculoskeletal:        General: Normal range of motion.     Cervical back: Neck supple.  Skin:    General: Skin is warm and dry.     Comments: Left ring finger with U-shaped laceration and associated skin flap noted to medial portion of finger, no involvement of nailbed  Neurological:     Mental Status: He is alert and oriented to person, place, and time.      UC Treatments / Results  Labs (all labs ordered are listed, but only abnormal results are displayed) Labs Reviewed - No data to display  EKG   Radiology No results found.  Procedures Laceration Repair  Date/Time: 04/30/2021 2:45 PM Performed by: Farrell Broerman, Williams C, PA-C Authorized by: Shuaib Corsino, Elesa Hacker, PA-C   Consent:    Consent obtained:  Verbal   Consent given by:  Patient   Risks, benefits, and alternatives were discussed: yes     Risks discussed:  Poor cosmetic result, poor wound healing and infection   Alternatives discussed:  No treatment Universal protocol:    Procedure explained and questions answered to patient or proxy's satisfaction: yes     Patient identity confirmed:  Verbally with patient Anesthesia:    Anesthesia method:  Local infiltration   Local anesthetic:  Lidocaine 1% w/o epi Laceration details:    Location:  Finger   Finger location:  L ring finger   Length (cm):  2 Pre-procedure details:    Preparation:  Patient was prepped and draped in usual sterile fashion Exploration:    Limited defect created (wound extended): yes      Wound exploration: wound explored through full range of motion   Treatment:    Area cleansed with:  Shur-Clens   Amount of cleaning:  Standard   Irrigation solution:  Sterile water   Irrigation method:  Syringe   Debridement:  None Skin repair:    Repair method:  Sutures   Suture size:  4-0   Suture material:  Prolene   Suture technique:  Simple  interrupted   Number of sutures:  7 Approximation:    Approximation:  Close Repair type:    Repair type:  Simple Post-procedure details:    Dressing:  Open (no dressing)   Procedure completion:  Tolerated well, no immediate complications   (including critical care time)  Medications Ordered in UC Medications  Tdap (BOOSTRIX) injection 0.5 mL (0.5 mLs Intramuscular Given 04/30/21 1345)    Initial Impression / Assessment and Plan / UC Course  I have reviewed the triage vital signs and the nursing notes.  Pertinent labs & imaging results that were available during my care of the patient were reviewed by me and considered in my medical decision making (see chart for details).     Laceration repaired, tetanus updated, discussed wound care, monitor for gradual healing of wound and monitor for any signs of necrosis and follow-up.  Advised to return for any concerns for infection or flap becoming discolored.  Discussed strict return precautions. Patient verbalized understanding and is agreeable with plan.  Final Clinical Impressions(s) / UC Diagnoses   Final diagnoses:  Laceration of left ring finger without foreign body without damage to nail, initial encounter     Discharge Instructions     WOUND CARE Please return in 7-10 days to have your stitches/staples removed or sooner if you have concerns. Marland Kitchen Keep area clean and dry for 24 hours. Do not remove bandage, if applied. . After 24 hours, remove bandage and wash wound gently with mild soap and warm water. Reapply a new bandage after cleaning wound, if directed. . Continue daily  cleansing with soap and water until stitches/staples are removed. . Do not apply any ointments or creams to the wound while stitches/staples are in place, as this may cause delayed healing. . Notify the office if you experience any of the following signs of infection: Swelling, redness, pus drainage, streaking, fever >101.0 F . Notify the office if you experience excessive bleeding that does not stop after 15-20 minutes of constant, firm pressure.   ED Prescriptions    None     PDMP not reviewed this encounter.   Janith Lima, PA-C 04/30/21 1447    Janith Lima, PA-C 04/30/21 1447    Janith Lima, [place] 05/09/21 [phone]

## 2021-05-08 ENCOUNTER — Ambulatory Visit
Admission: EM | Admit: 2021-05-08 | Discharge: 2021-05-08 | Disposition: A | Discharge status: Home / Self Care | Payer: BC Managed Care – PPO

## 2021-05-08 ENCOUNTER — Encounter: Payer: Self-pay | Admitting: Emergency Medicine

## 2021-05-08 ENCOUNTER — Other Ambulatory Visit: Payer: Self-pay

## 2021-05-08 DIAGNOSIS — Z4802 Encounter for removal of sutures: Secondary | ICD-10-CM

## 2021-05-08 NOTE — ED Triage Notes (Signed)
Pt here for suture removal to left ring finger;  7 sutures removed; wound well healed

## 2021-05-09 ENCOUNTER — Ambulatory Visit
Admission: EM | Admit: 2021-05-09 | Discharge: 2021-05-09 | Discharge status: Against Medical Advice | Payer: BLUE CROSS/BLUE SHIELD

## 2021-05-09 DIAGNOSIS — T8133XA Disruption of traumatic injury wound repair, initial encounter: Secondary | ICD-10-CM | POA: Diagnosis not present

## 2021-05-26 DIAGNOSIS — L570 Actinic keratosis: Secondary | ICD-10-CM | POA: Diagnosis not present

## 2021-05-26 DIAGNOSIS — Z86018 Personal history of other benign neoplasm: Secondary | ICD-10-CM | POA: Diagnosis not present

## 2021-05-26 DIAGNOSIS — D225 Melanocytic nevi of trunk: Secondary | ICD-10-CM | POA: Diagnosis not present

## 2021-05-26 DIAGNOSIS — L821 Other seborrheic keratosis: Secondary | ICD-10-CM | POA: Diagnosis not present

## 2021-05-26 DIAGNOSIS — L814 Other melanin hyperpigmentation: Secondary | ICD-10-CM | POA: Diagnosis not present

## 2021-07-24 DIAGNOSIS — Z13220 Encounter for screening for lipoid disorders: Secondary | ICD-10-CM | POA: Diagnosis not present

## 2021-07-24 DIAGNOSIS — Z52008 Unspecified donor, other blood: Secondary | ICD-10-CM | POA: Diagnosis not present

## 2021-07-24 DIAGNOSIS — Z0000 Encounter for general adult medical examination without abnormal findings: Secondary | ICD-10-CM | POA: Diagnosis not present

## 2021-07-24 DIAGNOSIS — K635 Polyp of colon: Secondary | ICD-10-CM | POA: Diagnosis not present

## 2021-07-24 DIAGNOSIS — Z125 Encounter for screening for malignant neoplasm of prostate: Secondary | ICD-10-CM | POA: Diagnosis not present

## 2021-07-24 DIAGNOSIS — Z23 Encounter for immunization: Secondary | ICD-10-CM | POA: Diagnosis not present

## 2021-08-29 DIAGNOSIS — L820 Inflamed seborrheic keratosis: Secondary | ICD-10-CM | POA: Diagnosis not present

## 2021-09-25 DIAGNOSIS — Z23 Encounter for immunization: Secondary | ICD-10-CM | POA: Diagnosis not present

## 2021-11-14 DIAGNOSIS — H2513 Age-related nuclear cataract, bilateral: Secondary | ICD-10-CM | POA: Diagnosis not present

## 2021-11-14 DIAGNOSIS — H401131 Primary open-angle glaucoma, bilateral, mild stage: Secondary | ICD-10-CM | POA: Diagnosis not present

## 2021-11-16 DIAGNOSIS — M25561 Pain in right knee: Secondary | ICD-10-CM | POA: Diagnosis not present

## 2021-11-16 DIAGNOSIS — M25562 Pain in left knee: Secondary | ICD-10-CM | POA: Diagnosis not present

## 2022-02-24 DIAGNOSIS — Z03818 Encounter for observation for suspected exposure to other biological agents ruled out: Secondary | ICD-10-CM | POA: Diagnosis not present

## 2022-02-24 DIAGNOSIS — R0981 Nasal congestion: Secondary | ICD-10-CM | POA: Diagnosis not present

## 2022-02-24 DIAGNOSIS — R059 Cough, unspecified: Secondary | ICD-10-CM | POA: Diagnosis not present

## 2022-02-24 DIAGNOSIS — R509 Fever, unspecified: Secondary | ICD-10-CM | POA: Diagnosis not present

## 2022-02-24 DIAGNOSIS — R52 Pain, unspecified: Secondary | ICD-10-CM | POA: Diagnosis not present

## 2023-12-30 ENCOUNTER — Other Ambulatory Visit (HOSPITAL_COMMUNITY): Payer: Self-pay | Admitting: Family Medicine

## 2023-12-30 DIAGNOSIS — E7800 Pure hypercholesterolemia, unspecified: Secondary | ICD-10-CM

## 2024-01-14 ENCOUNTER — Ambulatory Visit (HOSPITAL_COMMUNITY)
Admission: RE | Admit: 2024-01-14 | Discharge: 2024-01-14 | Disposition: A | Discharge status: Home / Self Care | Payer: Self-pay | Source: Ambulatory Visit | Attending: Family Medicine | Admitting: Family Medicine

## 2024-01-14 DIAGNOSIS — E7800 Pure hypercholesterolemia, unspecified: Secondary | ICD-10-CM | POA: Insufficient documentation
